# Patient Record
Sex: Female | Born: 1990
Health system: Southern US, Community
[De-identification: ages and names within clinical notes are randomized; demographics above are authoritative.]

## PROBLEM LIST (undated history)

## (undated) DIAGNOSIS — J101 Influenza due to other identified influenza virus with other respiratory manifestations: Secondary | ICD-10-CM

## (undated) DIAGNOSIS — Z789 Other specified health status: Secondary | ICD-10-CM

## (undated) DIAGNOSIS — O139 Gestational [pregnancy-induced] hypertension without significant proteinuria, unspecified trimester: Secondary | ICD-10-CM

## (undated) HISTORY — DX: Other specified health status: Z78.9

## (undated) HISTORY — PX: DILATION AND CURETTAGE OF UTERUS: SHX78

## (undated) HISTORY — DX: Influenza due to other identified influenza virus with other respiratory manifestations: J10.1

## (undated) HISTORY — PX: APPENDECTOMY: SHX54

---

## 2009-12-26 ENCOUNTER — Emergency Department: Payer: Self-pay | Admitting: Emergency Medicine

## 2010-07-09 ENCOUNTER — Emergency Department: Payer: Self-pay | Admitting: Emergency Medicine

## 2012-09-16 ENCOUNTER — Emergency Department: Payer: Self-pay | Admitting: Emergency Medicine

## 2014-04-09 ENCOUNTER — Other Ambulatory Visit (HOSPITAL_COMMUNITY)
Admission: RE | Admit: 2014-04-09 | Discharge: 2014-04-09 | Disposition: A | Discharge status: Home / Self Care | Payer: 59 | Source: Ambulatory Visit | Attending: Family Medicine | Admitting: Family Medicine

## 2014-04-09 DIAGNOSIS — Z0000 Encounter for general adult medical examination without abnormal findings: Secondary | ICD-10-CM | POA: Insufficient documentation

## 2016-11-06 ENCOUNTER — Emergency Department
Admission: EM | Admit: 2016-11-06 | Discharge: 2016-11-06 | Disposition: A | Discharge status: Home / Self Care | Payer: 59 | Attending: Emergency Medicine | Admitting: Emergency Medicine

## 2016-11-06 ENCOUNTER — Encounter: Payer: Self-pay | Admitting: Emergency Medicine

## 2016-11-06 DIAGNOSIS — N12 Tubulo-interstitial nephritis, not specified as acute or chronic: Secondary | ICD-10-CM | POA: Insufficient documentation

## 2016-11-06 LAB — URINALYSIS, ROUTINE W REFLEX MICROSCOPIC
Bilirubin Urine: NEGATIVE
Glucose, UA: NEGATIVE mg/dL
Ketones, ur: 5 mg/dL — AB
Nitrite: POSITIVE — AB
PH: 6 (ref 5.0–8.0)
Protein, ur: NEGATIVE mg/dL
SPECIFIC GRAVITY, URINE: 1.016 (ref 1.005–1.030)

## 2016-11-06 LAB — POCT PREGNANCY, URINE: PREG TEST UR: NEGATIVE

## 2016-11-06 MED ORDER — PHENAZOPYRIDINE HCL 100 MG PO TABS: 100.0000 mg | ORAL_TABLET | Freq: Three times a day (TID) | ORAL | 0 refills | Status: AC | PRN

## 2016-11-06 MED ORDER — LEVOFLOXACIN 750 MG PO TABS: 750.0000 mg | ORAL_TABLET | Freq: Every day | ORAL | 0 refills | Status: AC

## 2016-11-06 NOTE — ED Provider Notes (Signed)
[facility] Emergency Department Provider Note  ____________________________________________  Time seen: Approximately 5:36 PM  I have reviewed the triage vital signs and the nursing notes.   HISTORY  Chief Complaint Back Pain    HPI Danielle Waters is a 26 y.o. female presenting to the emergency department with bilateral flank pain, dysuria and increased urinary frequency for the past 3 days. Patient denies hematuria and history of pyelonephritis and nephrolithiasis. Patient has a history of urinary tract infections. Last UTI was in April of 2017. Patient has never been diagnosed with a STD. She reports no increase in vaginal discharge. She is in a monogamous relationship with one sexual partner. No dyspareunia. Patient has had chills and night sweats. Patient states that bilateral flank pain has worsened today and is intensified with ambulation. She rates her flank pain at 10/10 in intensity and describes it as aching. She has taken Azo but has attempted no other alleviating measures. Patient denies nausea, vomiting and abdominal pain.   History reviewed. No pertinent past medical history.  There are no active problems to display for this patient.   History reviewed. No pertinent surgical history.  Prior to Admission medications   Medication Sig Start Date End Date Taking? Authorizing Provider  levofloxacin (LEVAQUIN) 750 MG tablet Take 1 tablet (750 mg total) by mouth daily. 11/06/16 11/11/16  Orvil Feil, PA-C  phenazopyridine (PYRIDIUM) 100 MG tablet Take 1 tablet (100 mg total) by mouth 3 (three) times daily as needed for pain. 11/06/16 11/08/16  Orvil Feil, PA-C    Allergies Patient has no known allergies.  No family history on file.  Social History Social History  Substance Use Topics  . Smoking status: Never Smoker  . Smokeless tobacco: Never Used  . Alcohol use Yes     Review of Systems  Constitutional: She has had chills and night  sweats. Cardiovascular: no chest pain. Respiratory: no cough. No SOB. Gastrointestinal: No abdominal pain.  No nausea, no vomiting.  No diarrhea.  No constipation. Genitourinary: Patient has dysuria, increased urinary frequency and bilateral flank pain. Musculoskeletal: Patient has low back pain.  Skin: Negative for rash, abrasions, lacerations, ecchymosis. Neurological: Negative for headaches, focal weakness or numbness.   ____________________________________________   PHYSICAL EXAM:  VITAL SIGNS: ED Triage Vitals  Enc Vitals Group     BP 11/06/16 1658 140/67     Pulse Rate 11/06/16 1658 73     Resp 11/06/16 1658 18     Temp 11/06/16 1658 100.2 F (37.9 C)     Temp Source 11/06/16 1658 Oral     SpO2 11/06/16 1658 99 %     Weight 11/06/16 1659 168 lb (76.2 kg)     Height 11/06/16 1659 5\' 2"  (1.575 m)     Head Circumference --      Peak Flow --      Pain Score 11/06/16 1710 10     Pain Loc --      Pain Edu? --      Excl. in GC? --     Constitutional: Alert and oriented. She is sitting upright in chair. She does not appear distressed. Eyes: Conjunctivae are normal. PERRL. EOMI. Cardiovascular: Normal rate, regular rhythm. Normal S1 and S2.  Good peripheral circulation. Respiratory: Normal respiratory effort without tachypnea or retractions. Lungs CTAB. Good air entry to the bases with no decreased or absent breath sounds. Gastrointestinal: Bowel sounds 4 quadrants. She has suprapubic tenderness to palpation. No guarding or rigidity. No palpable  masses. No distention. She has bilateral CVA tenderness. Musculoskeletal: Full range of motion to all extremities. No gross deformities appreciated. Neurologic:  Normal speech and language. No gross focal neurologic deficits are appreciated.  Skin:  Skin is warm, dry and intact. No rash noted. Psychiatric: Mood and affect are normal. Speech and behavior are normal. Patient exhibits appropriate insight and  judgement.   ____________________________________________   LABS (all labs ordered are listed, but only abnormal results are displayed)  Labs Reviewed  URINALYSIS, ROUTINE W REFLEX MICROSCOPIC - Abnormal; Notable for the following:       Result Value   Color, Urine YELLOW (*)    APPearance HAZY (*)    Hgb urine dipstick SMALL (*)    Ketones, ur 5 (*)    Nitrite POSITIVE (*)    Leukocytes, UA MODERATE (*)    Bacteria, UA MANY (*)    Squamous Epithelial / LPF 0-5 (*)    All other components within normal limits  URINE CULTURE  POCT PREGNANCY, URINE  POC URINE PREG, ED   ____________________________________________  EKG   ____________________________________________  RADIOLOGY   No results found.  ____________________________________________    PROCEDURES  Procedure(s) performed:    Procedures    Medications - No data to display   ____________________________________________   INITIAL IMPRESSION / ASSESSMENT AND PLAN / ED COURSE  Pertinent labs & imaging results that were available during my care of the patient were reviewed by me and considered in my medical decision making (see chart for details).  Review of the [place] CSRS was performed in accordance of the [facility] prior to dispensing any controlled drugs.   Assessment and plan: Pyelonephritis: Patient presents to the emergency department with increased urinary frequency, dysuria and bilateral flank pain. Patient denies hematuria. Urinalysis conducted in the emergency department was contributory for cystitis. Urine was sent for culture. Vital signs are reassuring at this time. Patient will be treated as an outpatient. I do not feel that patient meets admission criteria at this time.  Patient was discharged with Levaquin for pyelonephritis. Patient was also discharged with Pyridium for dysuria. Strict return precautions were given twice. Patient was advised to follow up with urology in 1 week. All patient  questions were answered.  ____________________________________________  FINAL CLINICAL IMPRESSION(S) / ED DIAGNOSES  Final diagnoses:  Pyelonephritis      NEW MEDICATIONS STARTED DURING THIS VISIT:  Discharge Medication List as of 11/06/2016  6:30 PM    START taking these medications   Details  levofloxacin (LEVAQUIN) 750 MG tablet Take 1 tablet (750 mg total) by mouth daily., Starting Tue 11/06/2016, Until Sun 11/11/2016, Print    phenazopyridine (PYRIDIUM) 100 MG tablet Take 1 tablet (100 mg total) by mouth 3 (three) times daily as needed for pain., Starting Tue 11/06/2016, Until Thu 11/08/2016, Print            This chart was dictated using voice recognition software/Dragon. Despite best efforts to proofread, errors can occur which can change the meaning. Any change was purely unintentional.    Orvil FeilJaclyn M Woods, PA-C 11/06/16 1910    Myrna Blazeravid Matthew Schaevitz, MD 11/06/16 [phone]

## 2016-11-06 NOTE — ED Triage Notes (Signed)
States she is having lower back pain with some dysuria for the past 2 days

## 2016-11-09 LAB — URINE CULTURE: Culture: >=100000 — AB

## 2017-03-08 ENCOUNTER — Ambulatory Visit (INDEPENDENT_AMBULATORY_CARE_PROVIDER_SITE_OTHER): Payer: BC Managed Care – PPO | Admitting: Obstetrics & Gynecology

## 2017-03-08 ENCOUNTER — Encounter: Payer: Self-pay | Admitting: Obstetrics & Gynecology

## 2017-03-08 VITALS — BP 122/82 | HR 55 | Ht 62.0 in | Wt 201.0 lb

## 2017-03-08 DIAGNOSIS — Z1329 Encounter for screening for other suspected endocrine disorder: Secondary | ICD-10-CM | POA: Diagnosis not present

## 2017-03-08 DIAGNOSIS — Z124 Encounter for screening for malignant neoplasm of cervix: Secondary | ICD-10-CM | POA: Diagnosis not present

## 2017-03-08 DIAGNOSIS — Z1321 Encounter for screening for nutritional disorder: Secondary | ICD-10-CM

## 2017-03-08 DIAGNOSIS — E669 Obesity, unspecified: Secondary | ICD-10-CM

## 2017-03-08 DIAGNOSIS — Z13220 Encounter for screening for lipoid disorders: Secondary | ICD-10-CM

## 2017-03-08 DIAGNOSIS — R635 Abnormal weight gain: Secondary | ICD-10-CM

## 2017-03-08 DIAGNOSIS — Z0000 Encounter for general adult medical examination without abnormal findings: Secondary | ICD-10-CM

## 2017-03-08 DIAGNOSIS — Z131 Encounter for screening for diabetes mellitus: Secondary | ICD-10-CM

## 2017-03-08 NOTE — Patient Instructions (Signed)
Orlistat capsules What is this medicine? Orlistat (OR li stat) is used to help obese people lose weight and keep the weight off while eating a reduced-calorie diet. This medicine decreases the amount of fat that is absorbed from your diet. This medicine may be used for other purposes; ask your health care provider or pharmacist if you have questions. COMMON BRAND NAME(S): alli, Xenical What should I tell my health care provider before I take this medicine? They need to know if you have any of these conditions: -an eating disorder, such as anorexia or bulimia -gallbladder problems or gallstones -HIV or AIDS -kidney stones -liver disease or hepatitis -organ transplant -pancreatitis -problems absorbing food -seizures -stomach or intestine problems -take medicines that treat or prevent blood clots -an unusual or allergic reaction to orlistat, other medicines, foods, dyes, supplements or preservatives -pregnant or trying to get pregnant -breast-feeding How should I use this medicine? Take this medicine by mouth with a glass of water. Follow the directions on the prescription label. Take this medicine with each main meal that contains about 30 percent of the calories from fat or one hour after the meal. Do not take your medicine more often than directed. If you occasionally miss a meal or have a meal without fat, you can skip that dose of this medicine. Talk to your pediatrician regarding the use of this medicine in children. Special care may be needed. While this drug may be prescribed for children as young as 12 years for selected conditions, precautions do apply. Overdosage: If you think you have taken too much of this medicine contact a poison control center or emergency room at once. NOTE: This medicine is only for you. Do not share this medicine with others. What if I miss a dose? If you miss a dose, take it within one hour following the meal that contains fat. If it is almost time for  your next dose, take only that dose. Do not take double or extra doses. What may interact with this medicine? -amiodarone -antiviral medicines for HIV or AIDS -certain medicines for seizures like carbamazepine, phenobarbital, phenytoin -certain medicines that treat or prevent blood clots like warfarin, enoxaparin, dalteparin, apixaban, dabigatran, and rivaroxaban -cyclosporine -diabetes medicines -dietary supplements like beta-carotene and vitamins A, D, E, and K -other weight loss products -thyroid hormones This list may not describe all possible interactions. Give your health care provider a list of all the medicines, herbs, non-prescription drugs, or dietary supplements you use. Also tell them if you smoke, drink alcohol, or use illegal drugs. Some items may interact with your medicine. What should I watch for while using this medicine? Do not use this medicine if you have had an organ transplant. This medicine interferes with some of the medicines used to prevent transplant rejection. This medicine can cause decreased absorption of some vitamins. You should take a daily multivitamin that contains normal amounts of vitamins D, E, K and beta-carotene or vitamin A. Take the multivitamin once per day at bedtime unless otherwise directed by your doctor or healthcare professional. You should use this medicine with a reduced-calorie diet that contains no more than about 30 percent of the calories from fat. Divide your daily intake of fat, carbohydrates, and protein evenly over your 3 main meals. Follow a well-balanced, reduced-calorie, low fat diet. Try starting this diet before taking this medicine. Following a low fat diet can help reduce the possible side effects from this medicine. Do not use this medicine if you are pregnant. Losing  weight while pregnant is not advised and may cause harm to the unborn child. Talk to your health care professional or pharmacist for more information. What side  effects may I notice from receiving this medicine? Side effects that you should report to your doctor or health care professional as soon as possible: -allergic reactions like skin rash, itching or hives, swelling of the face, lips, or tongue -breathing problems -bloody or black, tarry stools -signs of infection like fever or chills -signs and symptoms of kidney stones like blood in the urine; pain in the lower back or side; pain when urinating -signs and symptoms of liver injury like dark yellow or brown urine; general ill feeling or flu-like symptoms; light-colored stools; loss of appetite; nausea; right upper belly pain; unusually weak or tired; yellowing of the eyes or skin -trouble passing urine or change in the amount of urine -uncontrolled, urgent bowel movements -vomiting Side effects that usually do not require medical attention (report to your doctor or health care professional if they continue or are bothersome): -increased number of bowel movements -oily stools (bowel movements may be clear, orange or brown in color) -stomach discomfort, gas This list may not describe all possible side effects. Call your doctor for medical advice about side effects. You may report side effects to FDA at 1-800-FDA-1088. Where should I keep my medicine? Keep out of the reach of children. Storage at room temperature between 15 and 30 degrees C (59 and 86 degrees F). Keep container tightly closed. Throw away any unused medicine after the expiration date. NOTE: This sheet is a summary. It may not cover all possible information. If you have questions about this medicine, talk to your doctor, pharmacist, or health care provider.  2018 Elsevier/Gold Standard (2015-05-18 16:16:49)

## 2017-03-08 NOTE — Progress Notes (Signed)
HPI:      Ms. Danielle Waters is a 26 y.o. G0P0000 who LMP was Patient's last menstrual period was 02/13/2017., she presents today for her annual examination. The patient has no complaints today. The patient is sexually active. Her last pap: approximate date 2015 and was normal. The patient does perform self breast exams.  There is no notable family history of breast or ovarian cancer in her family.  The patient has regular exercise: yes.  The patient denies current symptoms of depression.    GYN History: Contraception: none  PMHx: History reviewed. No pertinent past medical history. History reviewed. No pertinent surgical history. History reviewed. No pertinent family history. Social History  Substance Use Topics  . Smoking status: Never Smoker  . Smokeless tobacco: Never Used  . Alcohol use Yes   No current outpatient prescriptions on file. Allergies: Patient has no known allergies.  Review of Systems  Constitutional: Negative for chills, fever and malaise/fatigue.  HENT: Negative for congestion, sinus pain and sore throat.   Eyes: Negative for blurred vision and pain.  Respiratory: Negative for cough and wheezing.   Cardiovascular: Negative for chest pain and leg swelling.  Gastrointestinal: Negative for abdominal pain, constipation, diarrhea, heartburn, nausea and vomiting.  Genitourinary: Negative for dysuria, frequency, hematuria and urgency.  Musculoskeletal: Negative for back pain, joint pain, myalgias and neck pain.  Skin: Negative for itching and rash.  Neurological: Negative for dizziness, tremors and weakness.  Endo/Heme/Allergies: Does not bruise/bleed easily.  Psychiatric/Behavioral: Negative for depression. The patient is not nervous/anxious and does not have insomnia.     Objective: BP 122/82   Pulse (!) 55   Ht 5\' 2"  (1.575 m)   Wt 201 lb (91.2 kg)   LMP 02/13/2017   BMI 36.76 kg/m   Filed Weights   03/08/17 1400  Weight: 201 lb (91.2 kg)   Body mass  index is 36.76 kg/m. Physical Exam  Constitutional: She is oriented to person, place, and time. She appears well-developed and well-nourished. No distress.  Genitourinary: Rectum normal, vagina normal and uterus normal. Pelvic exam was performed with patient supine. There is no rash or lesion on the right labia. There is no rash or lesion on the left labia. Vagina exhibits no lesion. No bleeding in the vagina. Right adnexum does not display mass and does not display tenderness. Left adnexum does not display mass and does not display tenderness. Cervix does not exhibit motion tenderness, lesion, friability or polyp.   Uterus is mobile and midaxial. Uterus is not enlarged or exhibiting a mass.  HENT:  Head: Normocephalic and atraumatic. Head is without laceration.  Right Ear: Hearing normal.  Left Ear: Hearing normal.  Nose: No epistaxis.  No foreign bodies.  Mouth/Throat: Uvula is midline, oropharynx is clear and moist and mucous membranes are normal.  Eyes: Pupils are equal, round, and reactive to light.  Neck: Normal range of motion. Neck supple. No thyromegaly present.  Cardiovascular: Normal rate and regular rhythm.  Exam reveals no gallop and no friction rub.   No murmur heard. Pulmonary/Chest: Effort normal and breath sounds normal. No respiratory distress. She has no wheezes. Right breast exhibits no mass, no skin change and no tenderness. Left breast exhibits no mass, no skin change and no tenderness.  Abdominal: Soft. Bowel sounds are normal. She exhibits no distension. There is no tenderness. There is no rebound.  Musculoskeletal: Normal range of motion.  Neurological: She is alert and oriented to person, place, and time. No cranial  nerve deficit.  Skin: Skin is warm and dry.  Psychiatric: She has a normal mood and affect. Judgment normal.  Vitals reviewed.   Assessment:  ANNUAL EXAM 1. Annual physical exam   2. Screening for diabetes mellitus   3. Screening for cervical cancer    4. Screening for cholesterol level   5. Encounter for vitamin deficiency screening   6. Screening for thyroid disorder   7. Weight gain   8. Obesity (BMI 35.0-39.9 without comorbidity)    Screening Plan:            1.  Cervical Screening-  Pap smear done today  2. Breast screening- Exam annually and mammogram>40 planned   3. Colonoscopy every 10 years, Hemoccult testing - after age 24  4. Labs Ordered today  5. Counseling for contraception: no method  Other:  1. Annual physical exam Weight gain and obesity discussed Alli can be tried, not as high a concern if conceived while on this med  2. Screening for diabetes mellitus - Glucose, fasting; Future  3. Screening for cervical cancer - IGP, Aptima HPV  4. Screening for cholesterol level - Lipid panel; Future  5. Encounter for vitamin deficiency screening - Vitamin D 1,25 dihydroxy; Future  6. Screening for thyroid disorder - TSH; Future      F/U  Return in about 1 year (around 03/08/2018) for Annual.  Annamarie Major, MD, [facility], [facility] 03/08/2017  2:40 PM

## 2017-03-13 LAB — IGP, APTIMA HPV
HPV APTIMA: NEGATIVE
PAP SMEAR COMMENT: 0

## 2017-05-09 ENCOUNTER — Other Ambulatory Visit: Payer: BC Managed Care – PPO

## 2017-05-09 DIAGNOSIS — Z1321 Encounter for screening for nutritional disorder: Secondary | ICD-10-CM

## 2017-05-09 DIAGNOSIS — Z13220 Encounter for screening for lipoid disorders: Secondary | ICD-10-CM

## 2017-05-09 DIAGNOSIS — Z1329 Encounter for screening for other suspected endocrine disorder: Secondary | ICD-10-CM

## 2017-05-09 DIAGNOSIS — Z131 Encounter for screening for diabetes mellitus: Secondary | ICD-10-CM

## 2017-05-13 LAB — LIPID PANEL
CHOL/HDL RATIO: 3.8 ratio (ref 0.0–4.4)
Cholesterol, Total: 143 mg/dL (ref 100–199)
HDL: 38 mg/dL — ABNORMAL LOW (ref >39)
LDL Calculated: 93 mg/dL (ref 0–99)
TRIGLYCERIDES: 58 mg/dL (ref 0–149)
VLDL Cholesterol Cal: 12 mg/dL (ref 5–40)

## 2017-05-13 LAB — TSH: TSH: 0.803 u[IU]/mL (ref 0.450–4.500)

## 2017-05-13 LAB — VITAMIN D 1,25 DIHYDROXY
Vitamin D 1, 25 (OH)2 Total: 61 pg/mL
Vitamin D3 1, 25 (OH)2: 59 pg/mL

## 2017-05-13 LAB — GLUCOSE, FASTING: Glucose, Plasma: 81 mg/dL (ref 65–99)

## 2017-08-23 ENCOUNTER — Encounter: Payer: Self-pay | Admitting: Obstetrics and Gynecology

## 2017-08-26 ENCOUNTER — Ambulatory Visit (INDEPENDENT_AMBULATORY_CARE_PROVIDER_SITE_OTHER): Payer: BC Managed Care – PPO | Admitting: Obstetrics and Gynecology

## 2017-08-26 ENCOUNTER — Encounter: Payer: Self-pay | Admitting: Obstetrics and Gynecology

## 2017-08-26 VITALS — BP 145/82 | HR 89 | Ht 62.0 in | Wt 202.0 lb

## 2017-08-26 DIAGNOSIS — E282 Polycystic ovarian syndrome: Secondary | ICD-10-CM | POA: Diagnosis not present

## 2017-08-26 DIAGNOSIS — E669 Obesity, unspecified: Secondary | ICD-10-CM | POA: Insufficient documentation

## 2017-08-26 DIAGNOSIS — N939 Abnormal uterine and vaginal bleeding, unspecified: Secondary | ICD-10-CM | POA: Diagnosis not present

## 2017-08-26 DIAGNOSIS — R030 Elevated blood-pressure reading, without diagnosis of hypertension: Secondary | ICD-10-CM | POA: Insufficient documentation

## 2017-08-26 MED ORDER — MEDROXYPROGESTERONE ACETATE 10 MG PO TABS: 10.0000 mg | ORAL_TABLET | Freq: Every day | ORAL | 1 refills | Status: DC

## 2017-08-26 NOTE — Progress Notes (Signed)
[facility].  Had period 08/08/17, prior to this her last period was in June.  Has noticed headaches 2-3weeks. No previous issue with blood pressure. Not currently on any birth control.

## 2017-08-26 NOTE — Patient Instructions (Signed)
Polycystic Ovarian Syndrome °Polycystic ovarian syndrome (PCOS) is a common hormonal disorder among women of reproductive age. In most women with PCOS, many small fluid-filled sacs (cysts) grow on the ovaries, and the cysts are not part of a normal menstrual cycle. PCOS can cause problems with your menstrual periods and make it difficult to get pregnant. It can also cause an increased risk of miscarriage with pregnancy. If it is not treated, PCOS can lead to serious health problems, such as diabetes and heart disease. °What are the causes? °The cause of PCOS is not known, but it may be the result of a combination of certain factors, such as: °· Irregular menstrual cycle. °· High levels of certain hormones (androgens). °· Problems with the hormone that helps to control blood sugar (insulin resistance). °· Certain genes. ° °What increases the risk? °This condition is more likely to develop in women who have a family history of PCOS. °What are the signs or symptoms? °Symptoms of PCOS may include: °· Multiple ovarian cysts. °· Infrequent periods or no periods. °· Periods that are too frequent or too heavy. °· Unpredictable periods. °· Inability to get pregnant (infertility) because of not ovulating. °· Increased growth of hair on the face, chest, stomach, back, thumbs, thighs, or toes. °· Acne or oily skin. Acne may develop during adulthood, and it may not respond to treatment. °· Pelvic pain. °· Weight gain or obesity. °· Patches of thickened and dark brown or black skin on the neck, arms, breasts, or thighs (acanthosis nigricans). °· Excess hair growth on the face, chest, abdomen, or upper thighs (hirsutism). ° °How is this diagnosed? °This condition is diagnosed based on: °· Your medical history. °· A physical exam, including a pelvic exam. Your health care provider may look for areas of increased hair growth on your skin. °· Tests, such as: °? Ultrasound. This may be used to examine the ovaries and the lining of the  uterus (endometrium) for cysts. °? Blood tests. These may be used to check levels of sugar (glucose), female hormone (testosterone), and female hormones (estrogen and progesterone) in your blood. ° °How is this treated? °There is no cure for PCOS, but treatment can help to manage symptoms and prevent more health problems from developing. Treatment varies depending on: °· Your symptoms. °· Whether you want to have a baby or whether you need birth control (contraception). ° °Treatment may include nutrition and lifestyle changes along with: °· Progesterone hormone to start a menstrual period. °· Birth control pills to help you have regular menstrual periods. °· Medicines to make you ovulate, if you want to get pregnant. °· Medicine to reduce excessive hair growth. °· Surgery, in severe cases. This may involve making small holes in one or both of your ovaries. This decreases the amount of testosterone that your body produces. ° °Follow these instructions at home: °· Take over-the-counter and prescription medicines only as told by your health care provider. °· Follow a healthy meal plan. This can help you reduce the effects of PCOS. °? Eat a healthy diet that includes lean proteins, complex carbohydrates, fresh fruits and vegetables, low-fat dairy products, and healthy fats. Make sure to eat enough fiber. °· If you are overweight, lose weight as told by your health care provider. °? Losing 10% of your body weight may improve symptoms. °? Your health care provider can determine how much weight loss is best for you and can help you lose weight safely. °· Keep all follow-up visits as told by   your health care provider. This is important. °Contact a health care provider if: °· Your symptoms do not get better with medicine. °· You develop new symptoms. °This information is not intended to replace advice given to you by your health care provider. Make sure you discuss any questions you have with your health care  provider. °Document Released: 01/25/2005 Document Revised: 05/29/2016 Document Reviewed: 03/18/2016 °Elsevier Interactive Patient Education © 2018 [employer] ° °

## 2017-08-27 LAB — BETA HCG QUANT (REF LAB)

## 2017-08-27 NOTE — Progress Notes (Signed)
Obstetrics and Gynecology New Patient Evaluation  Appointment Date: 08/26/2017  OBGYN Clinic: [facility]  Primary Care Provider: Patient, No Pcp Waters  Referring Provider: self  Chief Complaint: aub, amenorrhea  History of Present Illness: Danielle Waters is a 26 y.o. African-American G0P0000 (Patient's last menstrual period was 08/08/2017.), seen for the above chief complaint. Her past medical history is significant for BMI 30s, ?HTN.  Patient here due to irregular periods. She states that she had normal periods qmonth, 5d and somewhat heavy and painful for the first few days, but starting this year, she's had irregular periods. LMP in October and prior to that was in June and March; her periods were like how they always are and she has no intermenstrual bleeding. She would like to conceive soon.  +hirsutism on face and belly  No breast s/s, fevers, chills, chest pain, SOB, nausea, vomiting, abdominal pain, dysuria, hematuria, vaginal itching, dyspareunia, diarrhea, constipation, blood in BMs  Review of Systems: as noted in the History of Present Illness.  Past Medical History:  History reviewed. No pertinent past medical history.  Past Surgical History:  No past surgical history on file.  Past Obstetrical History:  OB History  Gravida Para Term Preterm AB Living  0 0 0 0 0 0  SAB TAB Ectopic Multiple Live Births  0 0 0 0 0        Past Gynecological History: As Waters HPI. History of Pap Smear(s): Yes.   Last pap 2018, which was NILM History of STI(s): No. She is currently using nothing for contraception. Last used pills years ago   Social History:  Social History   Socioeconomic History  . Marital status: Single    Spouse name: Not on file  . Number of children: Not on file  . Years of education: Not on file  . Highest education level: Not on file  Social Needs  . Financial resource strain: Not on file  . Food insecurity - worry: Not  on file  . Food insecurity - inability: Not on file  . Transportation needs - medical: Not on file  . Transportation needs - non-medical: Not on file  Occupational History  . Not on file  Tobacco Use  . Smoking status: Never Smoker  . Smokeless tobacco: Never Used  Substance and Sexual Activity  . Alcohol use: Yes  . Drug use: No  . Sexual activity: Yes    Birth control/protection: None  Other Topics Concern  . Not on file  Social History Narrative  . Not on file    Family History:  Family History  Problem Relation Age of Onset  . Diabetes Mother   . Hypertension Mother   . Diabetes Maternal Grandmother   . Hypertension Maternal Grandmother    She denies any female cancers, bleeding or blood clotting disorders.   Medications None  Allergies Patient has no known allergies.   Physical Exam:  BP (!) 145/82   Pulse 89   Ht 5\' 2"  (1.575 m)   Wt 202 lb (91.6 kg)   LMP 08/08/2017   BMI 36.95 kg/m  Body mass index is 36.95 kg/m. Weight last year: 160s General appearance: Well nourished, well developed female in no acute distress.  Negative pelvic exam at her 04/2017 annual exam with [facility]  Laboratory: negative lipid panel, pap, tsh, fasting glucose, vitamin D 04/2017  Radiology: none  Assessment: pt stable  Plan:  1. Abnormal uterine bleeding (AUB) D/w her that based on her  s/s (hirsutism, oligomenorrhea) that she clinically has PCOS. Given prior negative exam and no intermenstrual bleeding, I don't feel an u/s would be helpful at this point.  Will get standard labs. D/w her that if labs are negative then best option for her is to cause her to have regular periods to decrease hyperplasia risk until weight loss is done. She is up 40lbs since January 2018 and that's when she last had regular periods. Interventions suggested to her and offered RD/nutrition consult but she declines at this point; I also told her that this also will help with her possible HTN.   Instructions on monthly provera given and pt aware that this isn't contraception.  - beta-hcg - DHEA-Sulfate, Serum - 17-Hydroxyprogesterone - Prolactin - HgB A1c - Testosterone, free - Testosterone - Basic metabolic panel  RTC [id number]  [name], [name] MD Attending [facility](Faculty Practice)

## 2017-08-28 ENCOUNTER — Encounter: Payer: Self-pay | Admitting: Obstetrics and Gynecology

## 2017-08-28 DIAGNOSIS — E282 Polycystic ovarian syndrome: Secondary | ICD-10-CM | POA: Insufficient documentation

## 2017-09-04 ENCOUNTER — Telehealth: Payer: Self-pay

## 2017-09-04 ENCOUNTER — Encounter: Payer: Self-pay | Admitting: Obstetrics and Gynecology

## 2017-09-04 DIAGNOSIS — R7303 Prediabetes: Secondary | ICD-10-CM | POA: Insufficient documentation

## 2017-09-04 LAB — TESTOSTERONE, FREE: Testosterone, Free: 3.1 pg/mL (ref 0.0–4.2)

## 2017-09-04 LAB — PROLACTIN: PROLACTIN: 10.8 ng/mL (ref 4.8–23.3)

## 2017-09-04 LAB — TESTOSTERONE: Testosterone: 54 ng/dL — ABNORMAL HIGH (ref 8–48)

## 2017-09-04 LAB — BASIC METABOLIC PANEL
BUN / CREAT RATIO: 13 (ref 9–23)
BUN: 10 mg/dL (ref 6–20)
CO2: 23 mmol/L (ref 20–29)
CREATININE: 0.8 mg/dL (ref 0.57–1.00)
Calcium: 9.9 mg/dL (ref 8.7–10.2)
Chloride: 102 mmol/L (ref 96–106)
GFR, EST AFRICAN AMERICAN: 116 mL/min/{1.73_m2} (ref >59)
GFR, EST NON AFRICAN AMERICAN: 101 mL/min/{1.73_m2} (ref >59)
Glucose: 68 mg/dL (ref 65–99)
Potassium: 4.6 mmol/L (ref 3.5–5.2)
Sodium: 140 mmol/L (ref 134–144)

## 2017-09-04 LAB — HEMOGLOBIN A1C
Est. average glucose Bld gHb Est-mCnc: 117 mg/dL
HEMOGLOBIN A1C: 5.7 % — AB (ref 4.8–5.6)

## 2017-09-04 LAB — 17-HYDROXYPROGESTERONE: 17 HYDROXYPROGESTERONE: 63 ng/dL

## 2017-09-04 LAB — DHEA-SULFATE, SERUM: DHEA: 382 ug/dL — AB

## 2017-09-04 NOTE — Telephone Encounter (Signed)
Patient called and left a message for the office to call us back. No answer and voice mail was to full to leave a message.

## 2017-09-09 NOTE — Telephone Encounter (Signed)
Call patient no answer

## 2017-09-12 ENCOUNTER — Ambulatory Visit: Payer: BC Managed Care – PPO | Admitting: Nurse Practitioner

## 2017-09-13 ENCOUNTER — Ambulatory Visit: Payer: BC Managed Care – PPO | Admitting: Nurse Practitioner

## 2017-09-17 ENCOUNTER — Encounter: Payer: Self-pay | Admitting: Radiology

## 2017-09-20 ENCOUNTER — Telehealth: Payer: Self-pay | Admitting: Radiology

## 2017-09-20 NOTE — Telephone Encounter (Signed)
Left message on the cell phone voicemail to call [facility] to schedule 3 month f/u appointment in February with Dr Vergie LivingPickens

## 2018-01-30 ENCOUNTER — Ambulatory Visit: Payer: BC Managed Care – PPO | Admitting: Family Medicine

## 2018-02-06 ENCOUNTER — Encounter: Payer: Self-pay | Admitting: Obstetrics & Gynecology

## 2018-02-06 ENCOUNTER — Ambulatory Visit (INDEPENDENT_AMBULATORY_CARE_PROVIDER_SITE_OTHER): Payer: BLUE CROSS/BLUE SHIELD | Admitting: Obstetrics & Gynecology

## 2018-02-06 VITALS — BP 116/75 | HR 51 | Ht 62.0 in | Wt 207.0 lb

## 2018-02-06 DIAGNOSIS — Z01419 Encounter for gynecological examination (general) (routine) without abnormal findings: Secondary | ICD-10-CM

## 2018-02-06 DIAGNOSIS — N970 Female infertility associated with anovulation: Secondary | ICD-10-CM

## 2018-02-06 DIAGNOSIS — Z113 Encounter for screening for infections with a predominantly sexual mode of transmission: Secondary | ICD-10-CM

## 2018-02-06 DIAGNOSIS — Z0000 Encounter for general adult medical examination without abnormal findings: Secondary | ICD-10-CM

## 2018-02-06 NOTE — Progress Notes (Signed)
Patient ID: Danielle ReedyCourtney Anes, female   DOB: 05/08/1991, 27 y.o.   MRN: [id number]  Chief Complaint  Patient presents with  . Gynecologic Exam    HPI Danielle Waters is a 27 y.o. female.  Single G0 here today to discuss her desire for pregnancy. She saw Dr. Vergie LivingPickens 11/18 and was diagnosed clinically with PCOS. He encouraged her to lose weight and did blood work. The blood work was normal. She took provera for 2 cycles. If she does not take provera, then she will skip some periods, had about 10 periods in 2018.   She has been with her current partner for about 18 months. He does not have children. She has been having unprotected sex for about a year, she uses OPKs and does not ovulate.  HPI  History reviewed. No pertinent past medical history.  History reviewed. No pertinent surgical history.  Family History  Problem Relation Age of Onset  . Diabetes Mother   . Hypertension Mother   . Diabetes Maternal Grandmother   . Hypertension Maternal Grandmother     Social History Social History   Tobacco Use  . Smoking status: Never Smoker  . Smokeless tobacco: Never Used  Substance Use Topics  . Alcohol use: Yes  . Drug use: No    No Known Allergies  Current Outpatient Medications  Medication Sig Dispense Refill  . medroxyPROGESTERone (PROVERA) 10 MG tablet Take 1 tablet (10 mg total) daily by mouth. One tab po qday x 14d per month. You can expect a period to start a few days after stopping it. 28 tablet 1   No current facility-administered medications for this visit.     Review of Systems Review of Systems  Pap smear normal 5/18  Blood pressure 116/75, pulse (!) 51, height 5\' 2"  (1.575 m), weight 207 lb (93.9 kg), last menstrual period 01/31/2018.  Physical Exam Physical Exam  Data Reviewed Status:  Final result Visible to patient:  Yes (MyChart) Next appt:  None Dx:  Screening for cervical cancer    Ref Range & Units 45mo ago   DIAGNOSIS:  Comment   Comment: NEGATIVE  FOR INTRAEPITHELIAL LESION AND MALIGNANCY.         Assessment    Desire for pregnancy- already taking PNVs Clearly needs ovulation induction but will also need semen analysis I will refer her to Dr. April MansonYalcinkaya    Plan    Rec healthy lifestyle STI screening       Danielle Waters 02/06/2018, 11:39 AM

## 2018-02-07 LAB — CERVICOVAGINAL ANCILLARY ONLY
Chlamydia: NEGATIVE
Neisseria Gonorrhea: NEGATIVE

## 2018-02-07 LAB — HIV ANTIBODY (ROUTINE TESTING W REFLEX): HIV Screen 4th Generation wRfx: NONREACTIVE

## 2018-02-07 LAB — HEPATITIS C ANTIBODY: Hep C Virus Ab: <0.1 s/co ratio (ref 0.0–0.9)

## 2018-02-07 LAB — HEPATITIS B SURFACE ANTIGEN: Hepatitis B Surface Ag: NEGATIVE

## 2018-02-07 LAB — RPR: RPR: NONREACTIVE

## 2018-03-04 ENCOUNTER — Telehealth: Payer: Self-pay | Admitting: Radiology

## 2018-03-04 NOTE — Telephone Encounter (Signed)
Left voicemail to see if patient was able to schedule an appointment with [facility]. Requested a call back to [facility] to confirm.

## 2018-03-06 ENCOUNTER — Encounter: Payer: Self-pay | Admitting: Radiology

## 2018-06-19 ENCOUNTER — Encounter: Payer: Self-pay | Admitting: Radiology

## 2018-06-30 ENCOUNTER — Other Ambulatory Visit (INDEPENDENT_AMBULATORY_CARE_PROVIDER_SITE_OTHER): Payer: BLUE CROSS/BLUE SHIELD

## 2018-06-30 VITALS — BP 122/77 | HR 78 | Resp 16 | Ht 62.0 in | Wt 195.4 lb

## 2018-06-30 DIAGNOSIS — R350 Frequency of micturition: Secondary | ICD-10-CM | POA: Diagnosis not present

## 2018-06-30 DIAGNOSIS — R300 Dysuria: Secondary | ICD-10-CM

## 2018-06-30 LAB — POCT URINALYSIS DIP (MANUAL ENTRY)
BILIRUBIN UA: NEGATIVE mg/dL
Bilirubin, UA: NEGATIVE
Glucose, UA: NEGATIVE mg/dL
LEUKOCYTES UA: NEGATIVE
Nitrite, UA: NEGATIVE
PH UA: 8.5 — AB (ref 5.0–8.0)
PROTEIN UA: NEGATIVE mg/dL
UROBILINOGEN UA: 0.2 U/dL

## 2018-06-30 NOTE — Progress Notes (Signed)
I have reviewed the chart and agree with nursing staff's documentation of this patient's encounter.  Danielle CollinsUgonna Salvador Bigbee, MD 06/30/2018 3:37 PM

## 2018-06-30 NOTE — Progress Notes (Signed)
SUBJECTIVE: Danielle Waters is a 27 y.o. female who complains of urinary frequency, urgency, odor and low right side flank pain for the last week. Patient reports she had a D&[facility] due to [facility].  Patient denies fever, chills, or abnormal vaginal discharge or bleeding.   OBJECTIVE: Appears well, in no apparent distress.  Vital signs are normal. Urine dipstick shows abnormal specific gravity, blood in urine, elevated pH and odor.   ASSESSMENT: Urinary frequency, dysuria, odor  PLAN:   Patient advised urine will be sent for culture. Patient advised she will be contacted by our office once lab results come in and are reviewed by a provider. Patient advised antibiotic will be prescribed if necessary. Call or return to clinic prn if these symptoms worsen or fail to improve as anticipated.

## 2018-07-01 LAB — URINE CULTURE: Organism ID, Bacteria: NO GROWTH

## 2018-07-10 ENCOUNTER — Other Ambulatory Visit (INDEPENDENT_AMBULATORY_CARE_PROVIDER_SITE_OTHER): Payer: BLUE CROSS/BLUE SHIELD

## 2018-07-10 ENCOUNTER — Encounter: Payer: Self-pay | Admitting: Radiology

## 2018-07-10 DIAGNOSIS — Z3202 Encounter for pregnancy test, result negative: Secondary | ICD-10-CM | POA: Diagnosis not present

## 2018-07-10 LAB — POCT URINE PREGNANCY: PREG TEST UR: NEGATIVE

## 2018-07-10 NOTE — Progress Notes (Signed)
Danielle Waters presents today for UPT. She reports having a D/C in August and has felt different since then.No other complaints    LMP: unknown before the D/C  OBJECTIVE: Appears well, in no apparent distress.  OB History    Gravida  0   Para  0   Term  0   Preterm  0   AB  0   Living  0     SAB  0   TAB  0   Ectopic  0   Multiple  0   Live Births  0          Home UPT Result: not taken at home In-Office UPT result: negative I have reviewed the patient's medical, obstetrical, social, and family histories, and medications.   ASSESSMENT: Negative pregnancy test  PLAN Follow up as needed

## 2018-10-15 NOTE — L&D Delivery Note (Signed)
Delivery Note Pt pushed x 2 contractions. At 3:40 PM a viable female was delivered via Vaginal, Spontaneous (Presentation: ROA). Shoulders delivered w/out difficulty. Compound right arm. APGAR: 5, 9; weight  5-11. NICU team present, but left due to infant w/ strong cry. Pitocin bolus started. Placenta status: Spontaneous, intact.  Cord: 3VC with the following complications: None.  Cord pH: NA  Infant w/ strong cry, but remained dusky despite stimulation. Taken to warmer for O2. NICU team called to return and assess baby.   Anesthesia: None Episiotomy: None Lacerations: 1st degree Suture Repair: NA. Laceration hemostatic w/ pressure Est. Blood Loss (mL): 250  Mom to [facility].  Baby to Couplet care / Skin to Skin. Magnesium Sulfate x 24 hours.  Please schedule this patient for Postpartum visit in: 4 weeks with the following provider: Any provider For C/S patients schedule nurse incision check in weeks 2 weeks: NA High risk pregnancy complicated by: HTN Delivery mode:  SVD Anticipated Birth Control:  LAM PP Procedures needed: BP check ~9/3 Schedule Integrated [place] visit: no   [place] 06/14/2019, 4:57 PM

## 2018-10-15 NOTE — L&D Delivery Note (Signed)
Infant taken to warmer at 1 minute.  Pulse ox applied. (37%)  Blow By (10L)  oxygen intiated x 3 minutes.  O2 level gradually increased to 90% 1552 NICU in room to evaluate infant.

## 2018-10-30 ENCOUNTER — Encounter
Admission: EM | Disposition: A | Discharge status: Home / Self Care | Payer: Self-pay | Source: Home / Self Care | Attending: Emergency Medicine

## 2018-10-30 ENCOUNTER — Observation Stay
Admission: EM | Admit: 2018-10-30 | Discharge: 2018-10-31 | Disposition: A | Discharge status: Home / Self Care | Payer: BLUE CROSS/BLUE SHIELD | Attending: Surgery | Admitting: Surgery

## 2018-10-30 ENCOUNTER — Observation Stay: Payer: BLUE CROSS/BLUE SHIELD | Admitting: Anesthesiology

## 2018-10-30 ENCOUNTER — Other Ambulatory Visit: Payer: Self-pay

## 2018-10-30 ENCOUNTER — Emergency Department: Payer: BLUE CROSS/BLUE SHIELD

## 2018-10-30 DIAGNOSIS — J101 Influenza due to other identified influenza virus with other respiratory manifestations: Secondary | ICD-10-CM | POA: Diagnosis not present

## 2018-10-30 DIAGNOSIS — Z6830 Body mass index (BMI) 30.0-30.9, adult: Secondary | ICD-10-CM | POA: Diagnosis not present

## 2018-10-30 DIAGNOSIS — E282 Polycystic ovarian syndrome: Secondary | ICD-10-CM | POA: Insufficient documentation

## 2018-10-30 DIAGNOSIS — Z79811 Long term (current) use of aromatase inhibitors: Secondary | ICD-10-CM | POA: Insufficient documentation

## 2018-10-30 DIAGNOSIS — Z7984 Long term (current) use of oral hypoglycemic drugs: Secondary | ICD-10-CM | POA: Diagnosis not present

## 2018-10-30 DIAGNOSIS — E669 Obesity, unspecified: Secondary | ICD-10-CM | POA: Diagnosis not present

## 2018-10-30 DIAGNOSIS — I10 Essential (primary) hypertension: Secondary | ICD-10-CM | POA: Diagnosis not present

## 2018-10-30 DIAGNOSIS — K760 Fatty (change of) liver, not elsewhere classified: Secondary | ICD-10-CM | POA: Diagnosis not present

## 2018-10-30 DIAGNOSIS — Z833 Family history of diabetes mellitus: Secondary | ICD-10-CM | POA: Insufficient documentation

## 2018-10-30 DIAGNOSIS — K3580 Unspecified acute appendicitis: Secondary | ICD-10-CM | POA: Diagnosis not present

## 2018-10-30 DIAGNOSIS — R599 Enlarged lymph nodes, unspecified: Secondary | ICD-10-CM | POA: Insufficient documentation

## 2018-10-30 DIAGNOSIS — R7303 Prediabetes: Secondary | ICD-10-CM | POA: Diagnosis not present

## 2018-10-30 HISTORY — PX: LAPAROSCOPIC APPENDECTOMY: SHX408

## 2018-10-30 LAB — URINALYSIS, COMPLETE (UACMP) WITH MICROSCOPIC
Bacteria, UA: NONE SEEN
Bilirubin Urine: NEGATIVE
Glucose, UA: NEGATIVE mg/dL
Hgb urine dipstick: NEGATIVE
Ketones, ur: 80 mg/dL — AB
Leukocytes, UA: NEGATIVE
Nitrite: NEGATIVE
Protein, ur: NEGATIVE mg/dL
Specific Gravity, Urine: 1.024 (ref 1.005–1.030)
pH: 6 (ref 5.0–8.0)

## 2018-10-30 LAB — LIPASE, BLOOD: Lipase: 23 U/L (ref 11–51)

## 2018-10-30 LAB — CBC WITH DIFFERENTIAL/PLATELET
Abs Immature Granulocytes: 0.01 10*3/uL (ref 0.00–0.07)
Basophils Absolute: 0 10*3/uL (ref 0.0–0.1)
Basophils Relative: 0 %
Eosinophils Absolute: 0 10*3/uL (ref 0.0–0.5)
Eosinophils Relative: 0 %
HCT: 41.6 % (ref 36.0–46.0)
HEMOGLOBIN: 14.1 g/dL (ref 12.0–15.0)
Immature Granulocytes: 0 %
LYMPHS PCT: 18 %
Lymphs Abs: 1.3 10*3/uL (ref 0.7–4.0)
MCH: 32.4 pg (ref 26.0–34.0)
MCHC: 33.9 g/dL (ref 30.0–36.0)
MCV: 95.6 fL (ref 80.0–100.0)
Monocytes Absolute: 0.5 10*3/uL (ref 0.1–1.0)
Monocytes Relative: 7 %
Neutro Abs: 5.7 10*3/uL (ref 1.7–7.7)
Neutrophils Relative %: 75 %
Platelets: 236 10*3/uL (ref 150–400)
RBC: 4.35 MIL/uL (ref 3.87–5.11)
RDW: 12.7 % (ref 11.5–15.5)
WBC: 7.6 10*3/uL (ref 4.0–10.5)
nRBC: 0 % (ref 0.0–0.2)

## 2018-10-30 LAB — TROPONIN I

## 2018-10-30 LAB — COMPREHENSIVE METABOLIC PANEL
ALT: 17 U/L (ref 0–44)
AST: 23 U/L (ref 15–41)
Albumin: 3.9 g/dL (ref 3.5–5.0)
Alkaline Phosphatase: 66 U/L (ref 38–126)
Anion gap: 10 (ref 5–15)
BUN: 9 mg/dL (ref 6–20)
CO2: 21 mmol/L — ABNORMAL LOW (ref 22–32)
Calcium: 9 mg/dL (ref 8.9–10.3)
Chloride: 105 mmol/L (ref 98–111)
Creatinine, Ser: 0.78 mg/dL (ref 0.44–1.00)
GFR calc Af Amer: >60 mL/min (ref >60)
GFR calc non Af Amer: >60 mL/min (ref >60)
Glucose, Bld: 114 mg/dL — ABNORMAL HIGH (ref 70–99)
Potassium: 3.9 mmol/L (ref 3.5–5.1)
Sodium: 136 mmol/L (ref 135–145)
Total Bilirubin: 0.7 mg/dL (ref 0.3–1.2)
Total Protein: 7.5 g/dL (ref 6.5–8.1)

## 2018-10-30 LAB — INFLUENZA PANEL BY PCR (TYPE A & B)
Influenza A By PCR: POSITIVE — AB
Influenza B By PCR: NEGATIVE

## 2018-10-30 LAB — POCT PREGNANCY, URINE: Preg Test, Ur: NEGATIVE

## 2018-10-30 SURGERY — APPENDECTOMY, LAPAROSCOPIC
Anesthesia: General | Site: Abdomen

## 2018-10-30 MED ORDER — OXYCODONE HCL 5 MG PO TABS
5.0000 mg | ORAL_TABLET | ORAL | Status: DC | PRN
  Administered 2018-10-30 – 2018-10-31 (×2): 5 mg via ORAL
  Filled 2018-10-30 (×2): qty 1

## 2018-10-30 MED ORDER — HYDROMORPHONE HCL 1 MG/ML IJ SOLN: 0.5000 mg | INTRAMUSCULAR | Status: DC | PRN

## 2018-10-30 MED ORDER — ROCURONIUM BROMIDE 50 MG/5ML IV SOLN
INTRAVENOUS | Status: AC
  Filled 2018-10-30: qty 1

## 2018-10-30 MED ORDER — PIPERACILLIN-TAZOBACTAM 3.375 G IVPB 30 MIN: 3.3750 g | Freq: Once | INTRAVENOUS | Status: DC

## 2018-10-30 MED ORDER — FENTANYL CITRATE (PF) 100 MCG/2ML IJ SOLN
INTRAMUSCULAR | Status: AC
  Filled 2018-10-30: qty 2

## 2018-10-30 MED ORDER — MORPHINE SULFATE (PF) 4 MG/ML IV SOLN
4.0000 mg | Freq: Once | INTRAVENOUS | Status: AC
  Administered 2018-10-30: 4 mg via INTRAVENOUS
  Filled 2018-10-30: qty 1

## 2018-10-30 MED ORDER — SODIUM CHLORIDE FLUSH 0.9 % IV SOLN
INTRAVENOUS | Status: AC
  Filled 2018-10-30: qty 10

## 2018-10-30 MED ORDER — KETOROLAC TROMETHAMINE 30 MG/ML IJ SOLN
INTRAMUSCULAR | Status: AC
  Filled 2018-10-30: qty 1

## 2018-10-30 MED ORDER — PROPOFOL 500 MG/50ML IV EMUL
INTRAVENOUS | Status: DC | PRN
  Administered 2018-10-30: 20 ug/kg/min via INTRAVENOUS

## 2018-10-30 MED ORDER — ACETAMINOPHEN 10 MG/ML IV SOLN
INTRAVENOUS | Status: DC | PRN
  Administered 2018-10-30: 1000 mg via INTRAVENOUS

## 2018-10-30 MED ORDER — SUGAMMADEX SODIUM 200 MG/2ML IV SOLN
INTRAVENOUS | Status: DC | PRN
  Administered 2018-10-30: 160 mg via INTRAVENOUS

## 2018-10-30 MED ORDER — PANTOPRAZOLE SODIUM 40 MG IV SOLR
40.0000 mg | Freq: Every day | INTRAVENOUS | Status: DC
  Administered 2018-10-30: 40 mg via INTRAVENOUS
  Filled 2018-10-30: qty 40

## 2018-10-30 MED ORDER — FENTANYL CITRATE (PF) 100 MCG/2ML IJ SOLN
INTRAMUSCULAR | Status: DC | PRN
  Administered 2018-10-30: 50 ug via INTRAVENOUS
  Administered 2018-10-30: 25 ug via INTRAVENOUS
  Administered 2018-10-30: 100 ug via INTRAVENOUS
  Administered 2018-10-30: 25 ug via INTRAVENOUS

## 2018-10-30 MED ORDER — ONDANSETRON HCL 4 MG/2ML IJ SOLN
4.0000 mg | Freq: Once | INTRAMUSCULAR | Status: AC
  Administered 2018-10-30: 4 mg via INTRAVENOUS
  Filled 2018-10-30: qty 2

## 2018-10-30 MED ORDER — LACTATED RINGERS IV SOLN
125.0000 mL/h | INTRAVENOUS | Status: DC
  Administered 2018-10-30: 125 mL/h via INTRAVENOUS
  Administered 2018-10-30: 14:00:00 via INTRAVENOUS
  Administered 2018-10-31: 125 mL/h via INTRAVENOUS

## 2018-10-30 MED ORDER — ACETAMINOPHEN 500 MG PO TABS: 1000.0000 mg | ORAL_TABLET | Freq: Four times a day (QID) | ORAL | Status: DC | PRN

## 2018-10-30 MED ORDER — FENTANYL CITRATE (PF) 100 MCG/2ML IJ SOLN: 25.0000 ug | INTRAMUSCULAR | Status: DC | PRN

## 2018-10-30 MED ORDER — BUPIVACAINE-EPINEPHRINE (PF) 0.5% -1:200000 IJ SOLN
INTRAMUSCULAR | Status: DC | PRN
  Administered 2018-10-30: 30 mL via PERINEURAL

## 2018-10-30 MED ORDER — ENOXAPARIN SODIUM 40 MG/0.4ML ~~LOC~~ SOLN
40.0000 mg | SUBCUTANEOUS | Status: DC
  Administered 2018-10-31: 40 mg via SUBCUTANEOUS
  Filled 2018-10-30: qty 0.4

## 2018-10-30 MED ORDER — ONDANSETRON HCL 4 MG/2ML IJ SOLN: 4.0000 mg | Freq: Four times a day (QID) | INTRAMUSCULAR | Status: DC | PRN

## 2018-10-30 MED ORDER — EPHEDRINE SULFATE 50 MG/ML IJ SOLN
INTRAMUSCULAR | Status: AC
  Filled 2018-10-30: qty 1

## 2018-10-30 MED ORDER — EPHEDRINE SULFATE 50 MG/ML IJ SOLN
INTRAMUSCULAR | Status: DC | PRN
  Administered 2018-10-30: 10 mg via INTRAVENOUS

## 2018-10-30 MED ORDER — ONDANSETRON 4 MG PO TBDP: 4.0000 mg | ORAL_TABLET | Freq: Four times a day (QID) | ORAL | Status: DC | PRN

## 2018-10-30 MED ORDER — SODIUM CHLORIDE 0.9 % IV SOLN
Freq: Once | INTRAVENOUS | Status: AC
  Administered 2018-10-30: 07:00:00 via INTRAVENOUS

## 2018-10-30 MED ORDER — ROCURONIUM BROMIDE 100 MG/10ML IV SOLN
INTRAVENOUS | Status: DC | PRN
  Administered 2018-10-30: 30 mg via INTRAVENOUS
  Administered 2018-10-30: 10 mg via INTRAVENOUS

## 2018-10-30 MED ORDER — PHENYLEPHRINE HCL 10 MG/ML IJ SOLN
INTRAMUSCULAR | Status: DC | PRN
  Administered 2018-10-30: 100 ug via INTRAVENOUS

## 2018-10-30 MED ORDER — IOHEXOL 300 MG/ML  SOLN
100.0000 mL | Freq: Once | INTRAMUSCULAR | Status: AC | PRN
  Administered 2018-10-30: 100 mL via INTRAVENOUS

## 2018-10-30 MED ORDER — DEXAMETHASONE SODIUM PHOSPHATE 10 MG/ML IJ SOLN
INTRAMUSCULAR | Status: DC | PRN
  Administered 2018-10-30: 10 mg via INTRAVENOUS

## 2018-10-30 MED ORDER — DEXAMETHASONE SODIUM PHOSPHATE 10 MG/ML IJ SOLN
INTRAMUSCULAR | Status: AC
  Filled 2018-10-30: qty 1

## 2018-10-30 MED ORDER — SUGAMMADEX SODIUM 200 MG/2ML IV SOLN
INTRAVENOUS | Status: AC
  Filled 2018-10-30: qty 2

## 2018-10-30 MED ORDER — PIPERACILLIN-TAZOBACTAM 3.375 G IVPB
3.3750 g | Freq: Three times a day (TID) | INTRAVENOUS | Status: DC
  Administered 2018-10-30 – 2018-10-31 (×4): 3.375 g via INTRAVENOUS
  Filled 2018-10-30 (×3): qty 50

## 2018-10-30 MED ORDER — SODIUM CHLORIDE 0.9 % IV SOLN
INTRAVENOUS | Status: DC
  Administered 2018-10-30: 16:00:00 via INTRAVENOUS

## 2018-10-30 MED ORDER — PROPOFOL 10 MG/ML IV BOLUS
INTRAVENOUS | Status: AC
  Filled 2018-10-30: qty 20

## 2018-10-30 MED ORDER — PROPOFOL 10 MG/ML IV BOLUS
INTRAVENOUS | Status: DC | PRN
  Administered 2018-10-30: 150 mg via INTRAVENOUS

## 2018-10-30 MED ORDER — GLYCOPYRROLATE 0.2 MG/ML IJ SOLN
INTRAMUSCULAR | Status: DC | PRN
  Administered 2018-10-30: 0.2 mg via INTRAVENOUS

## 2018-10-30 MED ORDER — KETOROLAC TROMETHAMINE 30 MG/ML IJ SOLN
INTRAMUSCULAR | Status: DC | PRN
  Administered 2018-10-30: 30 mg via INTRAVENOUS

## 2018-10-30 MED ORDER — ACETAMINOPHEN 10 MG/ML IV SOLN
INTRAVENOUS | Status: AC
  Filled 2018-10-30: qty 100

## 2018-10-30 MED ORDER — LIDOCAINE HCL (CARDIAC) PF 100 MG/5ML IV SOSY
PREFILLED_SYRINGE | INTRAVENOUS | Status: DC | PRN
  Administered 2018-10-30: 100 mg via INTRAVENOUS

## 2018-10-30 MED ORDER — ONDANSETRON HCL 4 MG/2ML IJ SOLN: 4.0000 mg | Freq: Once | INTRAMUSCULAR | Status: DC | PRN

## 2018-10-30 MED ORDER — KETOROLAC TROMETHAMINE 30 MG/ML IJ SOLN
30.0000 mg | Freq: Four times a day (QID) | INTRAMUSCULAR | Status: DC
  Administered 2018-10-30 – 2018-10-31 (×4): 30 mg via INTRAVENOUS
  Filled 2018-10-30 (×4): qty 1

## 2018-10-30 MED ORDER — MIDAZOLAM HCL 2 MG/2ML IJ SOLN
INTRAMUSCULAR | Status: DC | PRN
  Administered 2018-10-30: 2 mg via INTRAVENOUS

## 2018-10-30 MED ORDER — SUCCINYLCHOLINE CHLORIDE 20 MG/ML IJ SOLN
INTRAMUSCULAR | Status: DC | PRN
  Administered 2018-10-30: 90 mg via INTRAVENOUS

## 2018-10-30 MED ORDER — MIDAZOLAM HCL 2 MG/2ML IJ SOLN
INTRAMUSCULAR | Status: AC
  Filled 2018-10-30: qty 2

## 2018-10-30 MED ORDER — INFLUENZA VAC SPLIT QUAD 0.5 ML IM SUSY: 0.5000 mL | PREFILLED_SYRINGE | INTRAMUSCULAR | Status: DC

## 2018-10-30 MED ORDER — PIPERACILLIN-TAZOBACTAM 3.375 G IVPB
INTRAVENOUS | Status: AC
  Filled 2018-10-30: qty 50

## 2018-10-30 SURGICAL SUPPLY — 45 items
CANISTER SUCT 1200ML W/VALVE (MISCELLANEOUS) ×3 IMPLANT
CHLORAPREP W/TINT 26ML (MISCELLANEOUS) ×3 IMPLANT
COVER WAND RF STERILE (DRAPES) ×3 IMPLANT
CUTTER FLEX LINEAR 45M (STAPLE) ×2 IMPLANT
DERMABOND ADVANCED (GAUZE/BANDAGES/DRESSINGS) ×2
DERMABOND ADVANCED .7 DNX12 (GAUZE/BANDAGES/DRESSINGS) ×1 IMPLANT
ELECT CAUTERY BLADE 6.4 (BLADE) ×3 IMPLANT
ELECT REM PT RETURN 9FT ADLT (ELECTROSURGICAL) ×3
ELECTRODE REM PT RTRN 9FT ADLT (ELECTROSURGICAL) ×1 IMPLANT
GLOVE SURG SYN 7.0 (GLOVE) ×3 IMPLANT
GLOVE SURG SYN 7.0 PF PI (GLOVE) ×1 IMPLANT
GLOVE SURG SYN 7.5  E (GLOVE) ×2
GLOVE SURG SYN 7.5 E (GLOVE) ×1 IMPLANT
GLOVE SURG SYN 7.5 PF PI (GLOVE) ×1 IMPLANT
GOWN STRL REUS W/ TWL LRG LVL3 (GOWN DISPOSABLE) ×2 IMPLANT
GOWN STRL REUS W/TWL LRG LVL3 (GOWN DISPOSABLE) ×4
HANDLE YANKAUER SUCT BULB TIP (MISCELLANEOUS) ×2 IMPLANT
IRRIGATION STRYKERFLOW (MISCELLANEOUS) IMPLANT
IRRIGATOR STRYKERFLOW (MISCELLANEOUS)
IV NS 1000ML (IV SOLUTION) ×2
IV NS 1000ML BAXH (IV SOLUTION) ×1 IMPLANT
KIT TURNOVER KIT A (KITS) ×3 IMPLANT
LABEL OR SOLS (LABEL) ×3 IMPLANT
LIGASURE LAP MARYLAND 5MM 37CM (ELECTROSURGICAL) ×2 IMPLANT
NEEDLE HYPO 22GX1.5 SAFETY (NEEDLE) ×3 IMPLANT
NS IRRIG 500ML POUR BTL (IV SOLUTION) ×3 IMPLANT
PACK LAP CHOLECYSTECTOMY (MISCELLANEOUS) ×3 IMPLANT
PENCIL ELECTRO HAND CTR (MISCELLANEOUS) ×3 IMPLANT
POUCH SPECIMEN RETRIEVAL 10MM (ENDOMECHANICALS) ×3 IMPLANT
RELOAD 45 VASCULAR/THIN (ENDOMECHANICALS) ×3 IMPLANT
RELOAD STAPLE 45 2.5 WHT GRN (ENDOMECHANICALS) IMPLANT
RELOAD STAPLE 45 3.5 BLU ETS (ENDOMECHANICALS) ×1 IMPLANT
RELOAD STAPLE TA45 3.5 REG BLU (ENDOMECHANICALS) ×3 IMPLANT
SCISSORS METZENBAUM CVD 33 (INSTRUMENTS) ×3 IMPLANT
SLEEVE ADV FIXATION 5X100MM (TROCAR) ×6 IMPLANT
SUT MNCRL 4-0 (SUTURE) ×4
SUT MNCRL 4-0 27XMFL (SUTURE) ×2
SUT VIC AB 3-0 SH 27 (SUTURE) ×2
SUT VIC AB 3-0 SH 27X BRD (SUTURE) IMPLANT
SUT VICRYL 0 AB UR-6 (SUTURE) ×3 IMPLANT
SUTURE MNCRL 4-0 27XMF (SUTURE) ×1 IMPLANT
TRAY FOLEY MTR SLVR 16FR STAT (SET/KITS/TRAYS/PACK) ×3 IMPLANT
TROCAR BALLN GELPORT 12X130M (ENDOMECHANICALS) ×3 IMPLANT
TROCAR Z-THREAD OPTICAL 5X100M (TROCAR) ×3 IMPLANT
TUBING INSUFFLATION (TUBING) ×3 IMPLANT

## 2018-10-30 NOTE — Transfer of Care (Signed)
Immediate Anesthesia Transfer of Care Note  Patient: Danielle Waters  Procedure(s) Performed: APPENDECTOMY LAPAROSCOPIC (N/A Abdomen)  Patient Location: PACU  Anesthesia Type:General  Level of Consciousness: sedated  Airway & Oxygen Therapy: Patient Spontanous Breathing and Patient connected to face mask oxygen  Post-op Assessment: Report given to RN and Post -op Vital signs reviewed and stable  Post vital signs: Reviewed and stable  Last Vitals:  Vitals Value Taken Time  BP 137/66 10/30/2018  3:40 PM  Temp    Pulse 55 10/30/2018  3:45 PM  Resp 16 10/30/2018  3:45 PM  SpO2 100 % 10/30/2018  3:45 PM  Vitals shown include unvalidated device data.  Last Pain:  Vitals:   10/30/18 1330  TempSrc: Temporal  PainSc: 10-Worst pain ever         Complications: No apparent anesthesia complications

## 2018-10-30 NOTE — ED Triage Notes (Signed)
Pt arrived from home via [facility]. EMS reports that pt began to have cold-like symptoms on last week, she was seen in the Urgent Care on Tuesday and on tonight she began experiencing right side abdominal pain, diarrhea, nausea, vomiting and chills. Pt reports a recent miscarriage with d&c in September of last year and history of bradycardia. At this time the pt c/o abdominal pain at 10/10 and nausea. She denies chest pain, shortness of breath, coffee ground emesis or hematochezia.

## 2018-10-30 NOTE — Anesthesia Preprocedure Evaluation (Signed)
Anesthesia Evaluation  Patient identified by MRN, date of birth, ID band Patient awake    Reviewed: Allergy & Precautions, NPO status , Patient's Chart, lab work & pertinent test results, reviewed documented beta blocker date and time   Airway Mallampati: III  TM Distance: >3 FB     Dental  (+) Chipped   Pulmonary           Cardiovascular hypertension,      Neuro/Psych    GI/Hepatic   Endo/Other    Renal/GU      Musculoskeletal   Abdominal   Peds  Hematology   Anesthesia Other Findings Obese. Has the flu. HR in the 40's. PCOS.  Reproductive/Obstetrics                             Anesthesia Physical Anesthesia Plan  ASA: III  Anesthesia Plan: General   Post-op Pain Management:    Induction: Intravenous  PONV Risk Score and Plan:   Airway Management Planned: Oral ETT  Additional Equipment:   Intra-op Plan:   Post-operative Plan:   Informed Consent: I have reviewed the patients History and Physical, chart, labs and discussed the procedure including the risks, benefits and alternatives for the proposed anesthesia with the patient or authorized representative who has indicated his/her understanding and acceptance.       Plan Discussed with: CRNA  Anesthesia Plan Comments:         Anesthesia Quick Evaluation

## 2018-10-30 NOTE — ED Provider Notes (Addendum)
[facility] Emergency Department Provider Note       Time seen: ----------------------------------------- 7:04 AM on 10/30/2018 -----------------------------------------   I have reviewed the triage vital signs and the nursing notes.  HISTORY   Chief Complaint Abdominal Pain    HPI Danielle Waters is a 28 y.o. female with a history of prediabetes, polycystic ovaries who presents to the ED for abdominal pain and vomiting with diarrhea.  Patient arrives by EMS.  Over the past week she has had cold and flulike symptoms.  She was seen in urgent care on Tuesday was told it was probably just a cold.  Tonight she began experiencing right-sided abdominal pain with nausea, vomiting, diarrhea and chills.  She complains of 10 out of 10 abdominal pain.  No past medical history on file.  Patient Active Problem List   Diagnosis Date Noted  . Pre-diabetes 09/04/2017  . PCOS (polycystic ovarian syndrome) 08/28/2017  . Obesity (BMI 30-39.9) 08/26/2017  . Transient hypertension 08/26/2017    No past surgical history on file.  Allergies Patient has no known allergies.  Social History Social History   Tobacco Use  . Smoking status: Never Smoker  . Smokeless tobacco: Never Used  Substance Use Topics  . Alcohol use: Yes  . Drug use: No   Review of Systems Constitutional: Negative for fever. Cardiovascular: Negative for chest pain. Respiratory: Positive for recent cough Gastrointestinal: Positive for abdominal pain, vomiting and diarrhea Musculoskeletal: Negative for back pain. Skin: Negative for rash. Neurological: Negative for headaches, focal weakness or numbness.  All systems negative/normal/unremarkable except as stated in the HPI  ____________________________________________   PHYSICAL EXAM:  VITAL SIGNS: ED Triage Vitals  Enc Vitals Group     BP 10/30/18 0645 (!) 160/83     Pulse Rate 10/30/18 0652 62     Resp 10/30/18 0652 12     Temp  10/30/18 0652 99.1 F (37.3 C)     Temp Source 10/30/18 0652 Oral     SpO2 10/30/18 0646 98 %     Weight 10/30/18 0653 170 lb (77.1 kg)     Height 10/30/18 0653 5\' 3"  (1.6 m)     Head Circumference --      Peak Flow --      Pain Score 10/30/18 0652 10     Pain Loc --      Pain Edu? --      Excl. in GC? --    Constitutional: Alert and oriented.  Mild distress Eyes: Conjunctivae are normal. Normal extraocular movements. ENT      Head: Normocephalic and atraumatic.      Nose: No congestion/rhinnorhea.      Mouth/Throat: Mucous membranes are moist.      Neck: No stridor. Cardiovascular: Slow rate, regular rhythm. No murmurs, rubs, or gallops. Respiratory: Normal respiratory effort without tachypnea nor retractions. Breath sounds are clear and equal bilaterally. No wheezes/rales/rhonchi. Gastrointestinal: Nonfocal abdominal tenderness, no rebound or guarding.  Normal bowel sounds. Musculoskeletal: Nontender with normal range of motion in extremities. No lower extremity tenderness nor edema. Neurologic:  Normal speech and language. No gross focal neurologic deficits are appreciated.  Skin:  Skin is warm, dry and intact. No rash noted. Psychiatric: Mood and affect are normal. Speech and behavior are normal.  ____________________________________________  ED COURSE:  As part of my medical decision making, I reviewed the following data within the electronic MEDICAL RECORD NUMBER History obtained from family if available, nursing notes, old chart and ekg, as well as  notes from prior ED visits. Patient presented for abdominal pain with vomiting and diarrhea, we will assess with labs and imaging as indicated at this time.   Procedures ____________________________________________   LABS (pertinent positives/negatives)  Labs Reviewed  COMPREHENSIVE METABOLIC PANEL - Abnormal; Notable for the following components:      Result Value   CO2 21 (*)    Glucose, Bld 114 (*)    All other components  within normal limits  URINALYSIS, COMPLETE (UACMP) WITH MICROSCOPIC - Abnormal; Notable for the following components:   Color, Urine YELLOW (*)    APPearance HAZY (*)    Ketones, ur 80 (*)    All other components within normal limits  INFLUENZA PANEL BY PCR (TYPE A & B) - Abnormal; Notable for the following components:   Influenza A By PCR POSITIVE (*)    All other components within normal limits  CBC WITH DIFFERENTIAL/PLATELET  LIPASE, BLOOD  TROPONIN I  POC URINE PREG, ED  POCT PREGNANCY, URINE  I-STAT BETA HCG BLOOD, ED ([facility], [facility], [facility] ONLY)    RADIOLOGY Images were viewed by me  CT the abdomen pelvis with contrast IMPRESSION: 1.  Findings felt to be indicative of acute appendicitis.  Appendix: Location: Right mid pelvis arising from the medial cecum and extending inferomedially.  Diameter: Up to 11 mm.  Appendicolith: 8 x 3 mm at base of the appendix.  Mucosal hyper-enhancement: Present  Extraluminal gas: None  Periappendiceal collection: Fluid tracking along the posterior appendix. No frank abscess.  2. Evidence of collapsed cyst in each ovary with focal paraovarian fluid bilaterally. Small amount of fluid in cul-de-sac.  3. No abscess in the abdomen or pelvis. No evident bowel obstruction.  4.  No appreciable renal or ureteral calculus.  No hydronephrosis.  5.  Hepatic steatosis.  6. 2 mm nodular opacity in the superior segment right lower lobe. No follow-up needed if patient is low-risk. Non-contrast chest CT can be considered in 12 months if patient is high-risk. This recommendation follows the consensus statement: Guidelines for Management of Incidental Pulmonary Nodules Detected on CT Images: From the Fleischner Society 2017; Radiology 2017; 284:228-243. ____________________________________________   DIFFERENTIAL DIAGNOSIS   Gastroenteritis, dehydration, electrolyte abnormality, influenza, dehydration, pregnancy  FINAL ASSESSMENT AND  PLAN  Influenza A, appendicitis   Plan: The patient had presented for domino pain with vomiting and diarrhea. Patient's labs did indicate influenza A positivity. Patient's imaging revealed acute appendicitis.  I will discuss with general surgery for evaluation and appendectomy.   Ulice Dash, MD    Note: This note was generated in part or whole with voice recognition software. Voice recognition is usually quite accurate but there are transcription errors that can and very often do occur. I apologize for any typographical errors that were not detected and corrected.     Emily Filbert, MD 10/30/18 1152    Emily Filbert, MD 10/30/18 1213

## 2018-10-30 NOTE — ED Notes (Signed)
SURGEON AT BEDSIDE.  

## 2018-10-30 NOTE — Progress Notes (Signed)
Dr. Aleen Campi messaged: Heart rate is still in the 40's. checked during admission to 2c and heart rate was 47. Rechecked HR is 48bpm.

## 2018-10-30 NOTE — Op Note (Signed)
  Procedure Date:  10/30/2018  Pre-operative Diagnosis:  Acute appendicitis  Post-operative Diagnosis:  Acute appendicitis  Procedure:  Laparoscopic appendectomy  Surgeon:  Howie Ill, MD  Anesthesia:  General endotracheal  Estimated Blood Loss:  10 ml  Specimens:  appendix  Complications:  None  Indications for Procedure:  This is a 28 y.o. female who presents with abdominal pain and workup revealing acute appendicitis.  The options of surgery versus observation were reviewed with the patient and/or family. The risks of bleeding, infection, recurrence of symptoms, negative laparoscopy, potential for an open procedure, bowel injury, abscess or infection, were all discussed with the patient and she was willing to proceed.  Description of Procedure: The patient was correctly identified in the preoperative area and brought into the operating room.  The patient was placed supine with VTE prophylaxis in place.  Appropriate time-outs were performed.  Anesthesia was induced and the patient was intubated.  Foley catheter was placed.  Appropriate antibiotics were infused.  The abdomen was prepped and draped in a sterile fashion. An infraumbilical incision was made. A cutdown technique was used to enter the abdominal cavity without injury, and a Hasson trocar was inserted.  Pneumoperitoneum was obtained with appropriate opening pressures.  Two 5-mm ports were placed in the suprapubic and left lateral positions under direct visualization.  The right lower quadrant was inspected and the appendix was identified and found to be acutely inflamed.  The appendix was carefully dissected.  The mesoappendix was divided using the LigaSure.  The base of the appendix was dissected out and divided with a standard load Endo GIA.  The appendix was placed in an Endocatch bag.  The right lower quadrant was then inspected again revealing an intact staple line, no bleeding, and no bowel injury.  There was small  volume of fluid in the pelvis which was suctioned.  The 5 mm ports were removed under direct visualization and the Hasson trocar was removed.  The Endocatch bag was brought out through the umbilical incision.  The fascial opening was closed using 0 vicryl suture.  Local anesthetic was infused in all incisions The umbilical incision was closed with 3-0 Vicryl and 4-0 Monocryl, and the two 5 mm incisions were closed with 4-0 Monocryl.  The wounds were cleaned and sealed with DermaBond.  Foley catheter was removed and the patient was emerged from anesthesia and extubated and brought to the recovery room for further management.  The patient tolerated the procedure well and all counts were correct at the end of the case.   Howie Ill, MD

## 2018-10-30 NOTE — Anesthesia Postprocedure Evaluation (Signed)
Anesthesia Post Note  Patient: Danielle Waters  Procedure(s) Performed: APPENDECTOMY LAPAROSCOPIC (N/A Abdomen)  Patient location during evaluation: PACU Anesthesia Type: General Level of consciousness: awake and alert Pain management: pain level controlled Vital Signs Assessment: post-procedure vital signs reviewed and stable Respiratory status: spontaneous breathing and respiratory function stable Cardiovascular status: stable Anesthetic complications: no     Last Vitals:  Vitals:   10/30/18 1645 10/30/18 1654  BP:  107/60  Pulse: (!) 46 (!) 47  Resp: (!) 21 18  Temp:  37.1 C  SpO2: 100% 100%    Last Pain:  Vitals:   10/30/18 1654  TempSrc: Oral  PainSc:                  Amos Gaber K

## 2018-10-30 NOTE — Anesthesia Procedure Notes (Addendum)
Procedure Name: Intubation Date/Time: 10/30/2018 1:54 PM Performed by: Rudean Hitt, CRNA Pre-anesthesia Checklist: Patient identified, Patient being monitored, Timeout performed, Emergency Drugs available and Suction available Patient Re-evaluated:Patient Re-evaluated prior to induction Oxygen Delivery Method: Circle system utilized Preoxygenation: Pre-oxygenation with 100% oxygen Induction Type: IV induction Laryngoscope Size: Mac and 3 Grade View: Grade I Tube type: Oral Tube size: 7.0 mm Number of attempts: 1 Airway Equipment and Method: Stylet Placement Confirmation: ETT inserted through vocal cords under direct vision,  positive ETCO2 and breath sounds checked- equal and bilateral Secured at: 21 cm Tube secured with: Tape Dental Injury: Teeth and Oropharynx as per pre-operative assessment

## 2018-10-30 NOTE — Anesthesia Post-op Follow-up Note (Signed)
Anesthesia QCDR form completed.        

## 2018-10-30 NOTE — H&P (Signed)
Date of Admission:  10/30/2018  Reason for Admission:  Acute appendicitis  History of Present Illness: Danielle Waters is a 28 y.o. female presenting with a one day history of malaise with abdominal pain, nausea and vomiting, and diarrhea.  Over the past week she has been having cold and flulike symptoms and went to urgent care earlier this week for further evaluation.  She was told it was probably just a cold.  Last night, she started having abdominal pain as well as nausea vomiting and diarrhea and presents emergency room this morning for further evaluation.  In the emergency room, she tested positive for influenza type a but given her persistent abdominal pain requiring doses of pain medication, she had a CT scan of the abdomen pelvis which showed acute appendicitis.  I have independently seen the patient's imaging study and agree with the findings with dilated appendix with an appendicolith at the base and mild periappendiceal stranding.  Past Medical History: Patient reports that when she was born, she had a history of seizures and required phenobarbital medication for 1 year.  She has not had any seizures since.  She also has a history of polycystic ovarian syndrome and prediabetes.  Past Surgical History: Reports D&C done in the past  Home Medications: Prior to Admission medications   Medication Sig Start Date End Date Taking? Authorizing Provider  Chlorphen-Phenyleph-ASA (ALKA-SELTZER PLUS COLD) 2-7.8-325 MG TBEF Take 2 tablets by mouth every 6 (six) hours as needed.   Yes [provider]  letrozole (FEMARA) 2.5 MG tablet Take 2.5 mg by mouth daily. 09/16/18   [provider]  metFORMIN (GLUCOPHAGE) 1000 MG tablet Take 1,000 mg by mouth daily. 09/16/18   [provider]    Allergies: No Known Allergies  Social History:  reports that she has never smoked. She has never used smokeless tobacco. She reports current alcohol use. She reports that she does not use  drugs.   Family History: Family History  Problem Relation Age of Onset  . Diabetes Mother   . Hypertension Mother   . Diabetes Maternal Grandmother   . Hypertension Maternal Grandmother     Review of Systems: Review of Systems  Constitutional: Positive for chills, fever and malaise/fatigue.  HENT: Negative for hearing loss.   Respiratory: Negative for shortness of breath.   Cardiovascular: Negative for chest pain.  Gastrointestinal: Positive for abdominal pain, diarrhea, nausea and vomiting.  Genitourinary: Negative for dysuria.  Musculoskeletal: Positive for myalgias.  Skin: Negative for rash.  Neurological: Negative for dizziness.  Psychiatric/Behavioral: Negative for depression.    Physical Exam BP 131/69   Pulse (!) 42   Temp 99.1 F (37.3 C) (Oral)   Resp 19   Ht 5\' 3"  (1.6 m)   Wt 77.1 kg   LMP 10/07/2018   SpO2 100%   BMI 30.11 kg/m  CONSTITUTIONAL: No acute distress HEENT:  Normocephalic, atraumatic, extraocular motion intact. NECK: Trachea is midline, and there is no jugular venous distension.  RESPIRATORY:  Lungs are clear, and breath sounds are equal bilaterally. Normal respiratory effort without pathologic use of accessory muscles. CARDIOVASCULAR: Heart is regular without murmurs, gallops, or rubs. GI: The abdomen is soft, obese, nondistended, with tenderness to palpation in the right lower quadrant at McBurney's point.  No diffuse peritonitis.   MUSCULOSKELETAL:  Normal muscle strength and tone in all four extremities.  No peripheral edema or cyanosis. SKIN: Skin turgor is normal. There are no pathologic skin lesions.  NEUROLOGIC:  Motor and sensation is  grossly normal.  Cranial nerves are grossly intact. PSYCH:  Alert and oriented to person, place and time. Affect is normal.  Laboratory Analysis: Results for orders placed or performed during the hospital encounter of 10/30/18 (from the past 24 hour(s))  CBC with Differential     Status: None    Collection Time: 10/30/18  6:58 AM  Result Value Ref Range   WBC 7.6 4.0 - 10.5 K/uL   RBC 4.35 3.87 - 5.11 MIL/uL   Hemoglobin 14.1 12.0 - 15.0 g/dL   HCT 16.1 09.6 - 04.5 %   MCV 95.6 80.0 - 100.0 fL   MCH 32.4 26.0 - 34.0 pg   MCHC 33.9 30.0 - 36.0 g/dL   RDW 40.9 81.1 - 91.4 %   Platelets 236 150 - 400 K/uL   nRBC 0.0 0.0 - 0.2 %   Neutrophils Relative % 75 %   Neutro Abs 5.7 1.7 - 7.7 K/uL   Lymphocytes Relative 18 %   Lymphs Abs 1.3 0.7 - 4.0 K/uL   Monocytes Relative 7 %   Monocytes Absolute 0.5 0.1 - 1.0 K/uL   Eosinophils Relative 0 %   Eosinophils Absolute 0.0 0.0 - 0.5 K/uL   Basophils Relative 0 %   Basophils Absolute 0.0 0.0 - 0.1 K/uL   Immature Granulocytes 0 %   Abs Immature Granulocytes 0.01 0.00 - 0.07 K/uL  Comprehensive metabolic panel     Status: Abnormal   Collection Time: 10/30/18  6:58 AM  Result Value Ref Range   Sodium 136 135 - 145 mmol/L   Potassium 3.9 3.5 - 5.1 mmol/L   Chloride 105 98 - 111 mmol/L   CO2 21 (L) 22 - 32 mmol/L   Glucose, Bld 114 (H) 70 - 99 mg/dL   BUN 9 6 - 20 mg/dL   Creatinine, Ser 7.82 0.44 - 1.00 mg/dL   Calcium 9.0 8.9 - 95.6 mg/dL   Total Protein 7.5 6.5 - 8.1 g/dL   Albumin 3.9 3.5 - 5.0 g/dL   AST 23 15 - 41 U/L   ALT 17 0 - 44 U/L   Alkaline Phosphatase 66 38 - 126 U/L   Total Bilirubin 0.7 0.3 - 1.2 mg/dL   GFR calc non Af Amer >60 >60 mL/min   GFR calc Af Amer >60 >60 mL/min   Anion gap 10 5 - 15  Lipase, blood     Status: None   Collection Time: 10/30/18  6:58 AM  Result Value Ref Range   Lipase 23 11 - 51 U/L  Troponin I - Once     Status: None   Collection Time: 10/30/18  6:58 AM  Result Value Ref Range   Troponin I <0.03 <0.03 ng/mL  Urinalysis, Complete w Microscopic     Status: Abnormal   Collection Time: 10/30/18  7:16 AM  Result Value Ref Range   Color, Urine YELLOW (A) YELLOW   APPearance HAZY (A) CLEAR   Specific Gravity, Urine 1.024 1.005 - 1.030   pH 6.0 5.0 - 8.0   Glucose, UA NEGATIVE  NEGATIVE mg/dL   Hgb urine dipstick NEGATIVE NEGATIVE   Bilirubin Urine NEGATIVE NEGATIVE   Ketones, ur 80 (A) NEGATIVE mg/dL   Protein, ur NEGATIVE NEGATIVE mg/dL   Nitrite NEGATIVE NEGATIVE   Leukocytes, UA NEGATIVE NEGATIVE   RBC / HPF 11-20 0 - 5 RBC/hpf   WBC, UA 6-10 0 - 5 WBC/hpf   Bacteria, UA NONE SEEN NONE SEEN   Squamous Epithelial / LPF  0-5 0 - 5   Mucus PRESENT   Influenza panel by PCR (type A & B)     Status: Abnormal   Collection Time: 10/30/18  7:16 AM  Result Value Ref Range   Influenza A By PCR POSITIVE (A) NEGATIVE   Influenza B By PCR NEGATIVE NEGATIVE  Pregnancy, urine POC     Status: None   Collection Time: 10/30/18 11:15 AM  Result Value Ref Range   Preg Test, Ur NEGATIVE NEGATIVE    Imaging: Ct Abdomen Pelvis W Contrast  Result Date: 10/30/2018 CLINICAL DATA:  Abdominal pain and diarrhea EXAM: CT ABDOMEN AND PELVIS WITH CONTRAST TECHNIQUE: Multidetector CT imaging of the abdomen and pelvis was performed using the standard protocol following bolus administration of intravenous contrast. CONTRAST:  100mL OMNIPAQUE IOHEXOL 300 MG/ML  SOLN COMPARISON:  None. FINDINGS: Lower chest: There is no lung base edema or consolidation. On axial slice 1 series 4, there is a 2 mm nodular opacity in the superior segment right lower lobe. Hepatobiliary: There is a degree of hepatic steatosis. No focal liver lesions are appreciable. Gallbladder wall is not appreciably thickened. There is no biliary duct dilatation. Pancreas: No pancreatic mass or inflammatory focus. Spleen: No splenic lesions are evident. Adrenals/Urinary Tract: Adrenals bilaterally appear unremarkable. Kidneys bilaterally show no evident mass or hydronephrosis on either side. There is no renal or ureteral calculus on either side. Urinary bladder is midline with wall thickness within normal limits. Stomach/Bowel: There is no appreciable bowel wall or mesenteric thickening. There is no evident bowel obstruction. There  is no free air or portal venous air. Vascular/Lymphatic: There is no abdominal aortic aneurysm. No evident vascular lesions. There is no adenopathy in the abdomen or pelvis. Reproductive: The uterus is anteverted. There is a collapsed cyst in each ovary with surrounding paraovarian fluid on each side the ovarian cyst remnant on the right with wall enhancement measures 1.7 x 1.7 cm. The collapsed cyst remnant on the left measures 1.4 x 1.4 cm. There is minimal free fluid in the cul-de-sac. Other: There is appendiceal dilatation with a maximum transverse diameter of 11 mm. There is enhancement of much of the wall of the appendix. There is a focal appendicolith in the base of the appendix measuring 8 x 3 mm. There is mild stranding and fluid tracking along the appendix. No frank abscess or perforation. No abscess is seen in the abdomen or pelvis. There is no ascites beyond the mild fluid seen in the cul-de-sac region. Musculoskeletal: There are no blastic or lytic bone lesions. There is no intramuscular or abdominal wall lesion evident. IMPRESSION: 1.  Findings felt to be indicative of acute appendicitis. Appendix: Location: Right mid pelvis arising from the medial cecum and extending inferomedially. Diameter: Up to 11 mm. Appendicolith: 8 x 3 mm at base of the appendix. Mucosal hyper-enhancement: Present Extraluminal gas: None Periappendiceal collection: Fluid tracking along the posterior appendix. No frank abscess. 2. Evidence of collapsed cyst in each ovary with focal paraovarian fluid bilaterally. Small amount of fluid in cul-de-sac. 3. No abscess in the abdomen or pelvis. No evident bowel obstruction. 4.  No appreciable renal or ureteral calculus.  No hydronephrosis. 5.  Hepatic steatosis. 6. 2 mm nodular opacity in the superior segment right lower lobe. No follow-up needed if patient is low-risk. Non-contrast chest CT can be considered in 12 months if patient is high-risk. This recommendation follows the  consensus statement: Guidelines for Management of Incidental Pulmonary Nodules Detected on CT Images: From the  Fleischner Society 2017; Radiology 2017; [phone]. Critical Value/emergent results were called by telephone at the time of interpretation on 10/30/2018 at 11:48 am to Dr. Daryel November , who verbally acknowledged these results. Electronically Signed   By: Bretta Bang III M.D.   On: 10/30/2018 11:48    Assessment and Plan: This is a 29 y.o. female with influenza type a as well as acute appendicitis.  Discussed with the patient that her appendix does appear inflamed and distended on her CAT scan and despite of her normal lab work other than being positive for the flu, we would recommend surgery for her appendix.  Discussed with her that although she does have the flu, we should not wait until that is better in order to do her appendix.  At this point we would recommend a laparoscopic appendectomy.  Discussed with her the risks of bleeding, infection, injury to surrounding structures, as well as the potential risk for an open procedure.  She understands these risks and is willing to proceed.  She has been added on to the schedule for today and should go relatively soon.  Will be given IV Zosyn in the emergency room and continued postop.  She will be n.p.o. for now with IV fluid hydration appropriate pain and nausea control.   Howie Ill, MD [facility] Pg:  [phone]

## 2018-10-30 NOTE — ED Notes (Signed)
Patient transported to CT 

## 2018-10-30 NOTE — ED Notes (Signed)
Surgical consent signed

## 2018-10-31 ENCOUNTER — Encounter: Payer: Self-pay | Admitting: Surgery

## 2018-10-31 MED ORDER — IBUPROFEN 800 MG PO TABS: 800.0000 mg | ORAL_TABLET | Freq: Three times a day (TID) | ORAL | 0 refills | Status: DC | PRN

## 2018-10-31 MED ORDER — OXYCODONE HCL 5 MG PO TABS: 5.0000 mg | ORAL_TABLET | Freq: Four times a day (QID) | ORAL | 0 refills | Status: DC | PRN

## 2018-10-31 NOTE — Progress Notes (Signed)
Nutrition Brief Note  Patient identified on the Malnutrition Screening Tool (MST) Report  28 y/o female s/p laparoscopic appendectomy 1/16  Wt Readings from Last 15 Encounters:  10/30/18 77.1 kg  06/30/18 88.6 kg  02/06/18 93.9 kg  08/26/17 91.6 kg  03/08/17 91.2 kg  11/06/16 76.2 kg    Body mass index is 30.11 kg/m. Patient meets criteria for obesity based on current BMI.   Current diet order is GI soft, patient is consuming approximately 80% of meals at this time. Labs and medications reviewed.   Pt to discharge today   No nutrition interventions warranted at this time. If nutrition issues arise, please consult RD.   Betsey Holiday MS, RD, LDN Pager #- [phone] Office#- [phone] After Hours Pager: [phone]

## 2018-10-31 NOTE — Discharge Summary (Signed)
[facility] SURGICAL DISCHARGE SUMMARY   Patient ID: Danielle ReedyCourtney Waters MRN: [id number] DOB/AGE: 30/11/1990 30 y.o.  Admit date: 10/30/2018 Discharge date: 10/31/2018  Discharge Diagnoses Patient Active Problem List   Diagnosis Date Noted  . Acute appendicitis   . Influenza A   . Pre-diabetes 09/04/2017  . PCOS (polycystic ovarian syndrome) 08/28/2017  . Obesity (BMI 30-39.9) 08/26/2017  . Transient hypertension 08/26/2017    Consultants None  Procedures -- 10/30/2018:  Laparoscopic Appendectomy   HPI: Danielle ReedyCourtney Parkin is a 28 y.o. female presenting with a one day history of malaise with abdominal pain, nausea and vomiting, and diarrhea.  Over the past week she has been having cold and flulike symptoms and went to urgent care earlier this week for further evaluation.  She was told it was probably just a cold.  Last night, she started having abdominal pain as well as nausea vomiting and diarrhea and presents emergency room this morning for further evaluation.  In the emergency room, she tested positive for influenza type a but given her persistent abdominal pain requiring doses of pain medication, she had a CT scan of the abdomen pelvis which showed acute appendicitis.  I have independently seen the patient's imaging study and agree with the findings with dilated appendix with an appendicolith at the base and mild periappendiceal stranding  Hospital Course: Informed consent was obtained and documented, and patient underwent uneventful laparoscopic appendectomy (Dr Henrene DodgeJose PIscoya, MD, 10/30/2018).  Post-operatively, patient's pain improved/resolved and advancement of patient's diet and ambulation were well-tolerated. The remainder of patient's hospital course was essentially unremarkable, and discharge planning was initiated accordingly with patient safely able to be discharged home with appropriate discharge instructions, pain control, and outpatient follow-up after all of her  questions were answered to her expressed satisfaction.  Discharge Condition: Good   Physical Examination:  Constitutional: Well appearing female, NAD Pulmonary: Normal effort, no respiratory distress Gastrointestinal: Soft, incisional tenderness, non-distended  Skin: Laparoscopic incisions are CDI, no erythema, no drainage   Allergies as of 10/31/2018   No Known Allergies     Medication List    TAKE these medications   ALKA-SELTZER PLUS COLD 2-7.8-325 MG Tbef Generic drug:  Chlorphen-Phenyleph-ASA Take 2 tablets by mouth every 6 (six) hours as needed.   ibuprofen 800 MG tablet Commonly known as:  ADVIL,MOTRIN Take 1 tablet (800 mg total) by mouth every 8 (eight) hours as needed.   letrozole 2.5 MG tablet Commonly known as:  FEMARA Take 2.5 mg by mouth daily.   metFORMIN 1000 MG tablet Commonly known as:  GLUCOPHAGE Take 1,000 mg by mouth daily.   oxyCODONE 5 MG immediate release tablet Commonly known as:  Oxy IR/ROXICODONE Take 1 tablet (5 mg total) by mouth every 6 (six) hours as needed for severe pain or breakthrough pain.        Follow-up Information    Piscoya, Elita QuickJose, MD. Schedule an appointment as soon as possible for a visit in 2 week(s).   Specialty:  General Surgery Why:  s/p lap appy Contact information: [address] [place] [address] [phone]            -- Lynden OxfordZachary Korion Cuevas , PA-C [facility]  10/31/2018, 8:58 AM [phone] M-F: 7am - 4pm

## 2018-10-31 NOTE — Progress Notes (Signed)
Per MD okay for RN to Dc iv fluids.

## 2018-10-31 NOTE — Progress Notes (Signed)
Danielle Waters  A and O x 4. VSS. Pt tolerating diet well. No complaints of pain or nausea. IV removed intact, prescriptions given. Pt voiced understanding of discharge instructions with no further questions. Pt discharged via wheelchair with NT.  Allergies as of 10/31/2018   No Known Allergies     Medication List    TAKE these medications   ALKA-SELTZER PLUS COLD 2-7.8-325 MG Tbef Generic drug:  Chlorphen-Phenyleph-ASA Take 2 tablets by mouth every 6 (six) hours as needed.   ibuprofen 800 MG tablet Commonly known as:  ADVIL,MOTRIN Take 1 tablet (800 mg total) by mouth every 8 (eight) hours as needed.   letrozole 2.5 MG tablet Commonly known as:  FEMARA Take 2.5 mg by mouth daily.   metFORMIN 1000 MG tablet Commonly known as:  GLUCOPHAGE Take 1,000 mg by mouth daily.   oxyCODONE 5 MG immediate release tablet Commonly known as:  Oxy IR/ROXICODONE Take 1 tablet (5 mg total) by mouth every 6 (six) hours as needed for severe pain or breakthrough pain.       Vitals:   10/30/18 2013 10/31/18 0533  BP: (!) 104/52 109/64  Pulse: (!) 48 (!) 41  Resp: 18 20  Temp: 98.5 F (36.9 C) 98 F (36.7 C)  SpO2: 100% 100%    Suzzanne Cloud

## 2018-10-31 NOTE — Discharge Instructions (Signed)
In addition to included general post-operative instructions for laparoscopic appendectomy (appendix removal),  Diet: Resume home diet.   Activity: No heavy lifting >20 pounds (children, pets, laundry, garbage) or strenuous activity until follow-up, but light activity and walking are encouraged. Do not drive or drink alcohol if taking narcotic pain medications.  Wound care: 2 days after surgery (11/01/2018), you may shower/get incision wet with soapy water and pat dry (do not rub incisions), but no baths or submerging incision underwater until follow-up.   Medications: Resume all home medications. For mild to moderate pain: acetaminophen (Tylenol) or ibuprofen/naproxen (if no kidney disease). Combining Tylenol with alcohol can substantially increase your risk of causing liver disease. Narcotic pain medications, if prescribed, can be used for severe pain, though may cause nausea, constipation, and drowsiness. Do not combine Tylenol and Percocet (or similar) within a 6 hour period as Percocet (and similar) contain(s) Tylenol. If you do not need the narcotic pain medication, you do not need to fill the prescription.  Call office [phone] / [phone]) at any time if any questions, worsening pain, fevers/chills, bleeding, drainage from incision site, or other concerns.

## 2018-11-03 LAB — SURGICAL PATHOLOGY

## 2018-11-14 ENCOUNTER — Encounter: Payer: Self-pay | Admitting: Surgery

## 2018-11-14 ENCOUNTER — Other Ambulatory Visit: Payer: Self-pay

## 2018-11-14 ENCOUNTER — Ambulatory Visit (INDEPENDENT_AMBULATORY_CARE_PROVIDER_SITE_OTHER): Payer: BLUE CROSS/BLUE SHIELD | Admitting: Surgery

## 2018-11-14 VITALS — BP 127/79 | HR 66 | Temp 97.5°F | Ht 63.0 in | Wt 172.0 lb

## 2018-11-14 DIAGNOSIS — Z09 Encounter for follow-up examination after completed treatment for conditions other than malignant neoplasm: Secondary | ICD-10-CM

## 2018-11-14 DIAGNOSIS — K3580 Unspecified acute appendicitis: Secondary | ICD-10-CM

## 2018-11-14 NOTE — Patient Instructions (Addendum)
Patient will need to return to the office as needed, take miralax for the next few days call the office immediately if you start having nausea and vomiting.   Call the office with any questions or concerns.

## 2018-11-14 NOTE — Progress Notes (Signed)
11/14/2018  HPI: Barbera Hinze is a 28 y.o. female s/p laparoscopic appendectomy on 10/30/18. Presents today for follow up.  Reports having pain at the umbilical incision depending on type of motion she does.  Reports having initial constipation requiring a laxative, with no BM for 7 days.  Now reports having some mild nausea after eating, but no emesis.  Vital signs: BP 127/79   Pulse 66   Temp (!) 97.5 F (36.4 C) (Temporal)   Ht 5\' 3"  (1.6 m)   Wt 172 lb (78 kg)   SpO2 100%   BMI 30.47 kg/m    Physical Exam: Constitutional: No acute distress Abdomen:  Soft, non-distended, with some tenderness over umbilical incision.  Incisions are clean, dry, intact, without any evidence of infection.    Assessment/Plan: This is a 28 y.o. female s/p laparoscopic appendectomy.  --Pathology reviewed with patient.  Acute appendicitis without evidence of malignancy. --Discussed with patient that the discomfort at the umbilical incision is not uncommon because of the sutures that are placed to close the fascia.  This should continue to improve as the healing process continues and the sutures dissolve.   --The nausea may be related to the constipation that the patient is having.  Recommended that she take Miralax daily for a few days to help empty her intestines.  If she continues having nausea and if it progresses to emesis, without any BM and without any flatus, then she should call us for evaluation and possible admission if she develops ileus.  Patient understands this plan and all of her questions have been answered. --Otherwise follow up prn.   Howie Ill, MD  [facility]

## 2018-12-24 ENCOUNTER — Encounter: Payer: Self-pay | Admitting: Advanced Practice Midwife

## 2018-12-24 ENCOUNTER — Ambulatory Visit (INDEPENDENT_AMBULATORY_CARE_PROVIDER_SITE_OTHER): Payer: BLUE CROSS/BLUE SHIELD | Admitting: Advanced Practice Midwife

## 2018-12-24 ENCOUNTER — Other Ambulatory Visit: Payer: Self-pay

## 2018-12-24 VITALS — BP 106/72 | HR 72 | Wt 178.1 lb

## 2018-12-24 DIAGNOSIS — Z3201 Encounter for pregnancy test, result positive: Secondary | ICD-10-CM | POA: Diagnosis not present

## 2018-12-24 DIAGNOSIS — Z789 Other specified health status: Secondary | ICD-10-CM | POA: Diagnosis not present

## 2018-12-24 NOTE — Patient Instructions (Signed)

## 2018-12-24 NOTE — Progress Notes (Signed)
Patient reports going to ER on 12/21/2018 for constipation. They perform a pregnancy test and it was positive. Patient has concerns

## 2018-12-24 NOTE — Progress Notes (Addendum)
History:   Johnanna Gaona is a 28 y.o. G1P0 who presents to clinic for pregnancy confirmation. She endorses LMP of 10/07/2018 but had a negative pregnancy test prior to her laparoscopic appendectomy in January 2020. Patient reports recent episodes of constipation, nausea and vomiting for which he was triaged at Patient reports recent ED visit for constipation and nausea. She was prescribed Diclegis and Metamucil. She states these medications are managing her symptoms well.  Patient's history is significant for PCOS for which she was prescribed Metformin, as well as pregnancy loss and DNC in 05/2018.   No past medical history on file. Past Surgical History:  Procedure Laterality Date  . LAPAROSCOPIC APPENDECTOMY N/A 10/30/2018   Procedure: APPENDECTOMY LAPAROSCOPIC;  Surgeon: Henrene Dodge, MD;  Location: [facility] ORS;  Service: General;  Laterality: N/A;   Family History  Problem Relation Age of Onset  . Diabetes Mother   . Hypertension Mother   . Diabetes Maternal Grandmother   . Hypertension Maternal Grandmother    Social History   Tobacco Use  . Smoking status: Never Smoker  . Smokeless tobacco: Never Used  Substance Use Topics  . Alcohol use: Yes  . Drug use: No   No Known Allergies Current Outpatient Medications on File Prior to Visit  Medication Sig Dispense Refill  . Chlorphen-Phenyleph-ASA (ALKA-SELTZER PLUS COLD) 2-7.8-325 MG TBEF Take 2 tablets by mouth every 6 (six) hours as needed.    . Doxylamine-Pyridoxine 10-10 MG TBEC Take by mouth.    Marland Kitchen ibuprofen (ADVIL,MOTRIN) 800 MG tablet Take 1 tablet (800 mg total) by mouth every 8 (eight) hours as needed. (Patient not taking: Reported on 12/24/2018) 30 tablet 0  . letrozole (FEMARA) 2.5 MG tablet Take 2.5 mg by mouth daily.    Marland Kitchen METAMUCIL 0.52 g capsule     . metFORMIN (GLUCOPHAGE) 1000 MG tablet Take 1,000 mg by mouth daily.    Marland Kitchen oxyCODONE (OXY IR/ROXICODONE) 5 MG immediate release tablet Take 1 tablet (5 mg total) by mouth  every 6 (six) hours as needed for severe pain or breakthrough pain. (Patient not taking: Reported on 12/24/2018) 20 tablet 0   No current facility-administered medications on file prior to visit.     Review of Systems Pertinent items noted in HPI and remainder of comprehensive ROS otherwise negative. Physical Exam:   Vitals:   12/24/18 0938  BP: 106/72  Pulse: 72  Weight: 178 lb 1.6 oz (80.8 kg)     System: General: well-developed, well-nourished female in no acute distress   Skin: normal coloration and turgor, no rashes   Neurologic: oriented, normal, negative, normal mood   Extremities: normal strength, tone, and muscle mass, ROM of all joints is normal   HEENT PERRLA, extraocular movement intact and sclera clear, anicteric   Mouth/Teeth mucous membranes moist, pharynx normal without lesions and dental hygiene good   Neck supple and no masses   Cardiovascular: regular rate and rhythm   Respiratory:  no respiratory distress    Assessment:    Pregnancy: G1P0 Patient Active Problem List   Diagnosis Date Noted  . Acute appendicitis   . Influenza A   . Pre-diabetes 09/04/2017  . PCOS (polycystic ovarian syndrome) 08/28/2017  . Obesity (BMI 30-39.9) 08/26/2017  . Transient hypertension 08/26/2017     Plan:    1. Pregnancy test performed, pregnancy confirmed - Positive pregnancy test, uncertain LMP. Patient to have dating/viability scan then schedule New OB - Reviewed safe medications in pregnancy - US OB Transvaginal; Future -  US OB Comp Less 14 Wks; Future   2. Date of last menstrual period (LMP) unknown - Beta hCG quant (ref lab)  Greater than 50% of 20 minute visit spent in counseling and coordination of care  Clayton Bibles Certified Nurse Midwife Center for [employer], [facility]

## 2018-12-25 LAB — BETA HCG QUANT (REF LAB): hCG Quant: 43050 m[IU]/mL

## 2019-01-01 ENCOUNTER — Ambulatory Visit (HOSPITAL_COMMUNITY)
Admission: RE | Admit: 2019-01-01 | Discharge: 2019-01-01 | Disposition: A | Discharge status: Home / Self Care | Payer: BLUE CROSS/BLUE SHIELD | Source: Ambulatory Visit | Attending: Advanced Practice Midwife | Admitting: Advanced Practice Midwife

## 2019-01-01 ENCOUNTER — Other Ambulatory Visit: Payer: Self-pay

## 2019-01-01 DIAGNOSIS — Z3201 Encounter for pregnancy test, result positive: Secondary | ICD-10-CM | POA: Diagnosis present

## 2019-01-12 ENCOUNTER — Ambulatory Visit (INDEPENDENT_AMBULATORY_CARE_PROVIDER_SITE_OTHER): Payer: BLUE CROSS/BLUE SHIELD | Admitting: Obstetrics & Gynecology

## 2019-01-12 ENCOUNTER — Encounter: Payer: Self-pay | Admitting: Obstetrics & Gynecology

## 2019-01-12 VITALS — BP 123/77 | HR 56 | Wt 178.0 lb

## 2019-01-12 DIAGNOSIS — Z3A14 14 weeks gestation of pregnancy: Secondary | ICD-10-CM

## 2019-01-12 DIAGNOSIS — O0990 Supervision of high risk pregnancy, unspecified, unspecified trimester: Secondary | ICD-10-CM | POA: Insufficient documentation

## 2019-01-12 DIAGNOSIS — Z3689 Encounter for other specified antenatal screening: Secondary | ICD-10-CM

## 2019-01-12 DIAGNOSIS — Z3481 Encounter for supervision of other normal pregnancy, first trimester: Secondary | ICD-10-CM

## 2019-01-12 DIAGNOSIS — Z3480 Encounter for supervision of other normal pregnancy, unspecified trimester: Secondary | ICD-10-CM

## 2019-01-12 NOTE — Progress Notes (Signed)
Patient was very emotional during her visit, wanted an ultrasound from the check in point but was informed she had a formal one on 01/01/2019 and there was no indication for one. Normal FHR 140 bpm.  Patient says that she wanted a picture, she was offered a chance to view the images on the computer and take pictures with her phone.She was informed that the ultrasound was reassuring.  I informed her and tried to reassure her of the normal heart rate, also talked about different tests that will be done today to ensure all was well and even talked her into getting NIPS. She maintained that I am not making her "feel safe" this pregnancy because she did not get an ultrasound this visit as she "was promised". She felt that I did not care, and that I was condescending to her which I was baffled by, as I and everyone here was very nice to her from the beginning of the encounter. She reports that she had a miscarriage last pregnancy, wants to ensure all was well. She said she liked the midwife she saw before at previous visit ([place], [place]) and got up and stormed off. She was offered a rescheduled appointment with Thalia Bloodgood on her way out, but she was crying and very upset and did not pay any attention to anything I said.  I was sure that a NEW OB appointment was made for the patient with Thalia Bloodgood; this will be on 01/21/2019.  Jaynie Collins, MD, FACOG Obstetrician & Gynecologist, [facility] for [employer], [facility]

## 2019-01-12 NOTE — Patient Instructions (Signed)

## 2019-01-14 ENCOUNTER — Other Ambulatory Visit: Payer: Self-pay

## 2019-01-14 ENCOUNTER — Encounter: Payer: Self-pay | Admitting: Family Medicine

## 2019-01-14 ENCOUNTER — Ambulatory Visit (INDEPENDENT_AMBULATORY_CARE_PROVIDER_SITE_OTHER): Payer: BLUE CROSS/BLUE SHIELD | Admitting: Family Medicine

## 2019-01-14 VITALS — BP 112/71 | HR 65 | Wt 179.0 lb

## 2019-01-14 DIAGNOSIS — O09299 Supervision of pregnancy with other poor reproductive or obstetric history, unspecified trimester: Secondary | ICD-10-CM

## 2019-01-14 DIAGNOSIS — N760 Acute vaginitis: Secondary | ICD-10-CM | POA: Diagnosis not present

## 2019-01-14 DIAGNOSIS — Z029 Encounter for administrative examinations, unspecified: Secondary | ICD-10-CM

## 2019-01-14 DIAGNOSIS — B9689 Other specified bacterial agents as the cause of diseases classified elsewhere: Secondary | ICD-10-CM

## 2019-01-14 DIAGNOSIS — N898 Other specified noninflammatory disorders of vagina: Secondary | ICD-10-CM

## 2019-01-14 DIAGNOSIS — Z113 Encounter for screening for infections with a predominantly sexual mode of transmission: Secondary | ICD-10-CM

## 2019-01-14 DIAGNOSIS — O0990 Supervision of high risk pregnancy, unspecified, unspecified trimester: Secondary | ICD-10-CM

## 2019-01-14 DIAGNOSIS — O99282 Endocrine, nutritional and metabolic diseases complicating pregnancy, second trimester: Secondary | ICD-10-CM

## 2019-01-14 DIAGNOSIS — E282 Polycystic ovarian syndrome: Secondary | ICD-10-CM

## 2019-01-14 DIAGNOSIS — Z124 Encounter for screening for malignant neoplasm of cervix: Secondary | ICD-10-CM

## 2019-01-14 DIAGNOSIS — O99210 Obesity complicating pregnancy, unspecified trimester: Secondary | ICD-10-CM | POA: Insufficient documentation

## 2019-01-14 DIAGNOSIS — Z3A14 14 weeks gestation of pregnancy: Secondary | ICD-10-CM

## 2019-01-14 DIAGNOSIS — O23592 Infection of other part of genital tract in pregnancy, second trimester: Secondary | ICD-10-CM

## 2019-01-14 DIAGNOSIS — Z20828 Contact with and (suspected) exposure to other viral communicable diseases: Secondary | ICD-10-CM

## 2019-01-14 DIAGNOSIS — O09292 Supervision of pregnancy with other poor reproductive or obstetric history, second trimester: Secondary | ICD-10-CM

## 2019-01-14 DIAGNOSIS — O99212 Obesity complicating pregnancy, second trimester: Secondary | ICD-10-CM

## 2019-01-14 NOTE — Patient Instructions (Signed)

## 2019-01-14 NOTE — Progress Notes (Signed)
   INITIAL PRENATAL VISIT NOTE  Subjective:  Danielle Waters is a 28 y.o. G2P0010 at [redacted]w[redacted]d being seen today for ongoing prenatal care.  She is currently monitored for the following issues for this high risk pregnancy and has Obesity (BMI 30-39.9); Transient hypertension; PCOS (polycystic ovarian syndrome); Pre-diabetes; Supervision of high risk pregnancy, antepartum; and Obesity affecting pregnancy, antepartum on their problem list.  Patient reports no complaints.  Contractions: Not present. Vag. Bleeding: None.   . Denies leaking of fluid.   Too late for NT screening, desires NIPT  The following portions of the patient's history were reviewed and updated as appropriate: allergies, current medications, past family history, past medical history, past social history, past surgical history and problem list.   Objective:   Vitals:   01/14/19 0830  BP: 112/71  Pulse: 65  Weight: 179 lb (81.2 kg)    Fetal Status: Fetal Heart Rate (bpm): 146         General:  Alert, oriented and cooperative. Patient is in no acute distress.  Skin: Skin is warm and dry. No rash noted.   Cardiovascular: Normal heart rate noted  Respiratory: Normal respiratory effort, no problems with respiration noted  Abdomen: Soft, gravid, appropriate for gestational age.  Pain/Pressure: Absent     Pelvic: Cervical exam performed Dilation: Closed    - normal adnexa. Normal appearing cervix and vaginal mucosa. + white discharge with reports increased odor by patient.   Extremities: Normal range of motion.     Mental Status: Normal mood and affect. Normal behavior. Normal judgment and thought content.   Breasts: breasts appear normal, no suspicious masses, no skin or nipple changes or axillary nodes.  Assessment and Plan:  Pregnancy: G2P0010 at [redacted]w[redacted]d  1. Obesity affecting pregnancy, antepartum - recommended 11-15lbs weight gain - Hemoglobin A1c - TSH  2. Supervision of high risk pregnancy, antepartum - Reviewed  multidisciplinary care and nature of [facility] service - Reviewed typical prenatal care schedule and delivery facility - Partner with HSV 1 on mouth. Patient without lesions- request screening. Obtained HSV on pap and will get antibodies. Explained limitations to testing and that best test is culture of a lesion. Patient voiced understanding.  - [place] MFM OB COMP + 14 WK; Future - Genetic Screening - Obstetric Panel, Including HIV - Cytology - PAP - Hemoglobin A1c - Discussed babyscripts and patient would like to participate. Reviewed changes in routine prenatal care schedule and overall changes in typical schedule given Coronavirus   3. PCOS (polycystic ovarian syndrome) - Hemoglobin A1c  4. History of miscarriage, currently pregnant - Provided support today, patient is rightly very nervous and still processing Aug 2019 miscarriage - TSH  Preterm labor symptoms and general obstetric precautions including but not limited to vaginal bleeding, contractions, leaking of fluid and fetal movement were reviewed in detail with the patient. Please refer to After Visit Summary for other counseling recommendations.   Return in about 4 weeks (around 02/11/2019) for Routine prenatal care, after anatomy scan .  Future Appointments  Date Time Provider Department Center  02/11/2019  1:15 PM [facility] [place] 4 [facility]  02/12/2019  8:15 AM [place] Bing, MD [facility]    Federico Flake, MD

## 2019-01-15 LAB — OBSTETRIC PANEL, INCLUDING HIV
Antibody Screen: NEGATIVE
Basophils Absolute: 0 10*3/uL (ref 0.0–0.2)
Basos: 0 %
EOS (ABSOLUTE): 0 10*3/uL (ref 0.0–0.4)
Eos: 0 %
HIV Screen 4th Generation wRfx: NONREACTIVE
Hematocrit: 33.2 % — ABNORMAL LOW (ref 34.0–46.6)
Hemoglobin: 11.9 g/dL (ref 11.1–15.9)
Hepatitis B Surface Ag: NEGATIVE
Immature Grans (Abs): 0.1 10*3/uL (ref 0.0–0.1)
Immature Granulocytes: 1 %
Lymphocytes Absolute: 1.8 10*3/uL (ref 0.7–3.1)
Lymphs: 16 %
MCH: 34.6 pg — ABNORMAL HIGH (ref 26.6–33.0)
MCHC: 35.8 g/dL — ABNORMAL HIGH (ref 31.5–35.7)
MCV: 97 fL (ref 79–97)
Monocytes Absolute: 0.6 10*3/uL (ref 0.1–0.9)
Monocytes: 6 %
Neutrophils Absolute: 8.4 10*3/uL — ABNORMAL HIGH (ref 1.4–7.0)
Neutrophils: 77 %
Platelets: 268 10*3/uL (ref 150–450)
RBC: 3.44 x10E6/uL — ABNORMAL LOW (ref 3.77–5.28)
RDW: 12.8 % (ref 11.7–15.4)
RPR Ser Ql: NONREACTIVE
Rh Factor: POSITIVE
Rubella Antibodies, IGG: 2.22 index (ref >0.99)
WBC: 10.9 10*3/uL — ABNORMAL HIGH (ref 3.4–10.8)

## 2019-01-15 LAB — HSV-2 IGG SUPPLEMENTAL TEST: HSV-2 IgG Supplemental Test: NEGATIVE

## 2019-01-15 LAB — TSH: TSH: 1.98 u[IU]/mL (ref 0.450–4.500)

## 2019-01-15 LAB — CERVICOVAGINAL ANCILLARY ONLY
Bacterial vaginitis: POSITIVE — AB
Chlamydia: NEGATIVE
Neisseria Gonorrhea: NEGATIVE

## 2019-01-15 LAB — HSV(HERPES SMPLX)ABS-I+II(IGG+IGM)-BLD
HSV 1 Glycoprotein G Ab, IgG: <0.91 {index} (ref 0.00–0.90)
HSV 2 IgG, Type Spec: 1.77 {index} — ABNORMAL HIGH (ref 0.00–0.90)
HSVI/II Comb IgM: <0.91 ratio (ref 0.00–0.90)

## 2019-01-15 LAB — CYTOLOGY - PAP: Diagnosis: NEGATIVE

## 2019-01-15 LAB — HEMOGLOBIN A1C
Est. average glucose Bld gHb Est-mCnc: 105 mg/dL
Hgb A1c MFr Bld: 5.3 % (ref 4.8–5.6)

## 2019-01-16 LAB — URINE CULTURE, OB REFLEX

## 2019-01-16 LAB — CULTURE, OB URINE

## 2019-01-19 DIAGNOSIS — Z20828 Contact with and (suspected) exposure to other viral communicable diseases: Secondary | ICD-10-CM | POA: Insufficient documentation

## 2019-01-19 MED ORDER — METRONIDAZOLE 250 MG PO TABS: 250.0000 mg | ORAL_TABLET | Freq: Two times a day (BID) | ORAL | 0 refills | Status: DC

## 2019-01-19 NOTE — Addendum Note (Signed)
Addended by: Geanie Berlin on: 01/19/2019 01:26 PM   Modules accepted: Orders

## 2019-01-21 ENCOUNTER — Encounter: Payer: BLUE CROSS/BLUE SHIELD | Admitting: Advanced Practice Midwife

## 2019-01-21 ENCOUNTER — Encounter: Payer: Self-pay | Admitting: Radiology

## 2019-01-21 ENCOUNTER — Telehealth: Payer: Self-pay

## 2019-01-21 ENCOUNTER — Telehealth: Payer: Self-pay | Admitting: Radiology

## 2019-01-21 NOTE — Telephone Encounter (Signed)
Call patient to inform her of panorama results. She stated someone from the office had already called her with these results. I apologize and told patient it was not documented in her chart. Patient verbalize understanding this time.

## 2019-01-21 NOTE — Telephone Encounter (Signed)
Called and spoke with patient with Panorama results

## 2019-01-26 ENCOUNTER — Encounter: Payer: Self-pay | Admitting: Radiology

## 2019-02-11 ENCOUNTER — Encounter: Payer: BLUE CROSS/BLUE SHIELD | Admitting: Family Medicine

## 2019-02-11 ENCOUNTER — Ambulatory Visit (HOSPITAL_COMMUNITY)
Admission: RE | Admit: 2019-02-11 | Discharge: 2019-02-11 | Disposition: A | Discharge status: Home / Self Care | Payer: BLUE CROSS/BLUE SHIELD | Source: Ambulatory Visit | Attending: Obstetrics and Gynecology | Admitting: Obstetrics and Gynecology

## 2019-02-11 ENCOUNTER — Other Ambulatory Visit: Payer: Self-pay

## 2019-02-11 ENCOUNTER — Other Ambulatory Visit (HOSPITAL_COMMUNITY): Payer: Self-pay | Admitting: *Deleted

## 2019-02-11 DIAGNOSIS — Z362 Encounter for other antenatal screening follow-up: Secondary | ICD-10-CM

## 2019-02-11 DIAGNOSIS — Z363 Encounter for antenatal screening for malformations: Secondary | ICD-10-CM

## 2019-02-11 DIAGNOSIS — O0990 Supervision of high risk pregnancy, unspecified, unspecified trimester: Secondary | ICD-10-CM | POA: Insufficient documentation

## 2019-02-12 ENCOUNTER — Encounter: Payer: BLUE CROSS/BLUE SHIELD | Admitting: Obstetrics and Gynecology

## 2019-02-19 ENCOUNTER — Other Ambulatory Visit: Payer: Self-pay

## 2019-02-19 ENCOUNTER — Ambulatory Visit (INDEPENDENT_AMBULATORY_CARE_PROVIDER_SITE_OTHER): Payer: BLUE CROSS/BLUE SHIELD | Admitting: Obstetrics and Gynecology

## 2019-02-19 DIAGNOSIS — O0990 Supervision of high risk pregnancy, unspecified, unspecified trimester: Secondary | ICD-10-CM

## 2019-02-19 DIAGNOSIS — R030 Elevated blood-pressure reading, without diagnosis of hypertension: Secondary | ICD-10-CM

## 2019-02-19 DIAGNOSIS — R768 Other specified abnormal immunological findings in serum: Secondary | ICD-10-CM | POA: Insufficient documentation

## 2019-02-19 DIAGNOSIS — R7303 Prediabetes: Secondary | ICD-10-CM

## 2019-02-19 DIAGNOSIS — O99210 Obesity complicating pregnancy, unspecified trimester: Secondary | ICD-10-CM

## 2019-02-19 DIAGNOSIS — Z3A19 19 weeks gestation of pregnancy: Secondary | ICD-10-CM

## 2019-02-19 DIAGNOSIS — E669 Obesity, unspecified: Secondary | ICD-10-CM

## 2019-02-19 NOTE — Progress Notes (Addendum)
   TELEHEALTH VIRTUAL OBSTETRICS VISIT ENCOUNTER NOTE  I connected with Danielle Waters on 02/19/19 at 10:00 AM EDT by telephone at home and verified that I am speaking with the correct person using two identifiers.   I discussed the limitations, risks, security and privacy concerns of performing an evaluation and management service by telephone and the availability of in person appointments. I also discussed with the patient that there may be a patient responsible charge related to this service. The patient expressed understanding and agreed to proceed.  Subjective:  Danielle Waters is a 28 y.o. G2P0010 at [redacted]w[redacted]d being followed for ongoing prenatal care.  She is currently monitored for the following issues for this low-risk pregnancy and has Obesity (BMI 30-39.9); Transient hypertension; PCOS (polycystic ovarian syndrome); Pre-diabetes; Supervision of high risk pregnancy, antepartum; Obesity affecting pregnancy, antepartum; Herpes exposure; and HSV-2 seropositive on their problem list.  Patient reports no complaints. Reports fetal movement. Denies any contractions, bleeding or leaking of fluid.   The following portions of the patient's history were reviewed and updated as appropriate: allergies, current medications, past family history, past medical history, past social history, past surgical history and problem list.   Objective:  There were no vitals filed for this visit.  General:  Alert, oriented and cooperative.   Mental Status: Normal mood and affect perceived. Normal judgment and thought content.  Rest of physical exam deferred due to type of encounter  Assessment and Plan:  Pregnancy: G2P0010 at [redacted]w[redacted]d 1. Supervision of high risk pregnancy, antepartum Routine care. F/u completion anatomy u/s. 28wk labs nv  2. Pre-diabetes  3. HSV-2 seropositive D/w pt later in pregnancy re: ppx  4. Transient hypertension Normal baby scripts data. Recommend q10d checks  5. Obesity (BMI  30-39.9) D/w her re: weight goals. Pt doesn't have scale at home  6. Obesity affecting pregnancy, antepartum   Preterm labor symptoms and general obstetric precautions including but not limited to vaginal bleeding, contractions, leaking of fluid and fetal movement were reviewed in detail with the patient.  I discussed the assessment and treatment plan with the patient. The patient was provided an opportunity to ask questions and all were answered. The patient agreed with the plan and demonstrated an understanding of the instructions. The patient was advised to call back or seek an in-person office evaluation/go to MAU at [facility] for any urgent or concerning symptoms. Please refer to After Visit Summary for other counseling recommendations.   I provided 10 minutes of non-face-to-face time during this encounter. The visit was conducted via Webex-medicine  Return in about 2 months (around 04/21/2019) for rob in person and 2h GTT.  Future Appointments  Date Time Provider Department Center  03/11/2019  2:45 PM [facility] [place] 2 [facility]US     Bing, MD Center for [facility], [facility]

## 2019-02-19 NOTE — Progress Notes (Signed)
I connected with  Danielle Waters on 02/19/19 at 10:00 AM EDT by telephone and verified that I am speaking with the correct person using two identifiers.   I discussed the limitations, risks, security and privacy concerns of performing an evaluation and management service by telephone and the availability of in person appointments. I also discussed with the patient that there may be a patient responsible charge related to this service. The patient expressed understanding and agreed to proceed.  Scheryl Marten, RN 02/19/2019  10:05 AM

## 2019-02-24 ENCOUNTER — Telehealth: Payer: Self-pay | Admitting: *Deleted

## 2019-02-24 NOTE — Telephone Encounter (Signed)
Pt called stating she feels like she pulled a muscle in her back and didn't know if she needed to be seen. Pt denies any vaginal bleeding. Instructed pt to try taking tylenol and use a heat pad on the area and to rest. If it does not get better to call the office back. Pt verbalizes.

## 2019-03-11 ENCOUNTER — Other Ambulatory Visit (HOSPITAL_COMMUNITY): Payer: Self-pay | Admitting: *Deleted

## 2019-03-11 ENCOUNTER — Ambulatory Visit (HOSPITAL_COMMUNITY)
Admission: RE | Admit: 2019-03-11 | Discharge: 2019-03-11 | Disposition: A | Discharge status: Home / Self Care | Payer: BLUE CROSS/BLUE SHIELD | Source: Ambulatory Visit | Attending: Maternal & Fetal Medicine | Admitting: Maternal & Fetal Medicine

## 2019-03-11 ENCOUNTER — Other Ambulatory Visit: Payer: Self-pay

## 2019-03-11 DIAGNOSIS — Z362 Encounter for other antenatal screening follow-up: Secondary | ICD-10-CM | POA: Diagnosis present

## 2019-03-11 DIAGNOSIS — Z3A22 22 weeks gestation of pregnancy: Secondary | ICD-10-CM | POA: Diagnosis not present

## 2019-03-16 ENCOUNTER — Telehealth: Payer: Self-pay

## 2019-03-16 NOTE — Telephone Encounter (Signed)
Patient is requesting a note for her employer due to high risk pregnancy and would like to work form home. Note has been provided.

## 2019-04-07 ENCOUNTER — Observation Stay
Admission: EM | Admit: 2019-04-07 | Discharge: 2019-04-07 | Disposition: A | Discharge status: Home / Self Care | Payer: BC Managed Care – PPO | Attending: Obstetrics and Gynecology | Admitting: Obstetrics and Gynecology

## 2019-04-07 ENCOUNTER — Other Ambulatory Visit: Payer: Self-pay

## 2019-04-07 DIAGNOSIS — R030 Elevated blood-pressure reading, without diagnosis of hypertension: Secondary | ICD-10-CM | POA: Insufficient documentation

## 2019-04-07 DIAGNOSIS — R7303 Prediabetes: Secondary | ICD-10-CM | POA: Insufficient documentation

## 2019-04-07 DIAGNOSIS — Z3A26 26 weeks gestation of pregnancy: Secondary | ICD-10-CM | POA: Insufficient documentation

## 2019-04-07 DIAGNOSIS — R102 Pelvic and perineal pain: Secondary | ICD-10-CM | POA: Insufficient documentation

## 2019-04-07 DIAGNOSIS — O26892 Other specified pregnancy related conditions, second trimester: Secondary | ICD-10-CM | POA: Diagnosis not present

## 2019-04-07 DIAGNOSIS — Z7984 Long term (current) use of oral hypoglycemic drugs: Secondary | ICD-10-CM | POA: Insufficient documentation

## 2019-04-07 DIAGNOSIS — O99212 Obesity complicating pregnancy, second trimester: Secondary | ICD-10-CM | POA: Diagnosis not present

## 2019-04-07 DIAGNOSIS — O26899 Other specified pregnancy related conditions, unspecified trimester: Secondary | ICD-10-CM | POA: Diagnosis present

## 2019-04-07 LAB — URINALYSIS, COMPLETE (UACMP) WITH MICROSCOPIC
Bilirubin Urine: NEGATIVE
Glucose, UA: NEGATIVE mg/dL
Hgb urine dipstick: NEGATIVE
Ketones, ur: 5 mg/dL — AB
Leukocytes,Ua: NEGATIVE
Nitrite: NEGATIVE
Protein, ur: NEGATIVE mg/dL
Specific Gravity, Urine: 1.021 (ref 1.005–1.030)
pH: 6 (ref 5.0–8.0)

## 2019-04-07 MED ORDER — ACETAMINOPHEN 500 MG PO TABS
ORAL_TABLET | ORAL | Status: AC
  Administered 2019-04-07: 1000 mg via ORAL
  Filled 2019-04-07: qty 2

## 2019-04-07 MED ORDER — ACETAMINOPHEN 500 MG PO TABS
1000.0000 mg | ORAL_TABLET | Freq: Once | ORAL | Status: AC
  Administered 2019-04-07: 1000 mg via ORAL

## 2019-04-07 NOTE — Progress Notes (Signed)
Patient ID: Danielle Waters, female   DOB: 06-Mar-1991, 28 y.o.   MRN: [id number]  Danielle Waters is a 28 y.o. female. She is at [redacted]w[redacted]d gestation. No LMP recorded. Patient is pregnant. Estimated Date of Delivery: 07/10/19 EGA 26+4 weeks . Unassigned  Pt by seen by Dr Ilda Basset  Prenatal care site: [facility]  Chief complaint:central pelvic pressure with walking . Started today ./ Only with walking . No LOF , No vaginal bleeding . No prior MVA or similar pain .  Pregnancy complicated by :  1. Supervision of high risk pregnancy, antepartum Routine care. F/u completion anatomy u/s. 28wk labs nv  2. Pre-diabetes  3. HSV-2 seropositive D/w pt later in pregnancy re: ppx  4. Transient hypertension Normal baby scripts data. Recommend q10d checks  5. Obesity (BMI 30-39.9) D/w her re: weight goals. Pt doesn't have scale at home  6. Obesity affecting pregnancy, antepartum  S: Resting comfortably. no CTX, no VB.no LOF,  Active fetal movement.  Maternal Medical History:   Past Medical History:  Diagnosis Date  . Influenza A   . Medical history non-contributory     Past Surgical History:  Procedure Laterality Date  . APPENDECTOMY    . DILATION AND CURETTAGE OF UTERUS    . LAPAROSCOPIC APPENDECTOMY N/A 10/30/2018   Procedure: APPENDECTOMY LAPAROSCOPIC;  Surgeon: Olean Ree, MD;  Location: [facility] ORS;  Service: General;  Laterality: N/A;    No Known Allergies  Prior to Admission medications   Medication Sig Start Date End Date Taking? Authorizing Provider  Prenatal Vit-Fe Fumarate-FA (MULTIVITAMIN-PRENATAL) 27-0.8 MG TABS tablet Take 1 tablet by mouth daily at 12 noon.   Yes [provider]  Chlorphen-Phenyleph-ASA (ALKA-SELTZER PLUS COLD) 2-7.8-325 MG TBEF Take 2 tablets by mouth every 6 (six) hours as needed.    [provider]  METAMUCIL 0.52 g capsule  12/23/18   [provider]  metFORMIN (GLUCOPHAGE) 1000 MG tablet Take 1,000 mg by mouth daily. 09/16/18    [provider]  metroNIDAZOLE (FLAGYL) 250 MG tablet Take 1 tablet (250 mg total) by mouth 2 (two) times daily. Patient not taking: Reported on 02/19/2019 01/19/19   Caren Macadam, MD     Social History: She  reports that she has never smoked. She has never used smokeless tobacco. She reports previous alcohol use. She reports that she does not use drugs.  Family History: family history includes Diabetes in her maternal grandmother and mother; Hypertension in her maternal grandmother and mother.  no history of gyn cancers  Review of Systems: Review of Systems: A full review of systems was performed and negative except as noted in the HPI.   Eyes: no vision change  Ears: left ear pain  Oropharynx: no sore throat  Pulmonary . No shortness of breath , no hemoptysis Cardiovascular: no chest pain , no irregular heart beat  Gastrointestinal:no blood in stool . No diarrhea, no constipation Uro gynecologic: no dysuria , no pelvic pain Neurologic : no seizure , no migraines    Musculoskeletal: no muscular weakness, + pubic bone pain   O:  BP 128/71 (BP Location: Left Arm)   Pulse 87   Temp 98.8 F (37.1 C) (Oral)   Resp 18   Ht 5\' 3"  (1.6 m)   Wt 74.8 kg   BMI 29.23 kg/m  No results found for this or any previous visit (from the past 48 hour(s)).   Constitutional: NAD, AAOx3  HE/ENT: extraocular movements grossly intact, moist mucous membranes CV: RRR  PULM: nl respiratory effort, CTABL  Back : no CVAT     Abd: gravid, non-tender, non-distended, soft      Ext: Non-tender, Nonedmeatous   Psych: mood appropriate, speech normal Pelvic cx : closed / long and OOP ; no blood on examining glove   NST: reactive 10x10  Baseline: 130 Variability: moderate Accelerations present x >2 Decelerations absent Time 20mins    Assessment: 28 y.o. 564w4d with new onset pelvic pain . Most c/w symphysis pain  Principle diagnosis:   Plan: UAC+S  Reassured pt she is not in labor  and fetus has a reassuring fetal monitoring  1000 mg Tylenol   Labor: not present.   Fetal Wellbeing: Reassuring Cat 1 tracing.  Reactive NST   D/c home stable, precautions reviewed, follow-up as scheduled.    recommend that she calls her managing OB in future for issues that arise after hours .  ----- Beverly Gust Jamarii Banks MD Attending Obstetrician and Gynecologist [facility], Department of OB/GYN [facility]

## 2019-04-07 NOTE — Discharge Summary (Signed)
Patient ID: Danielle ReedyCourtney Detwiler, female   DOB: 04/30/1991, 28 y.o.   MRN: [id number]  Subjective   Danielle Waters is a 28 y.o. female. She is at 6079w4d gestation. No LMP recorded. Patient is pregnant. Estimated Date of Delivery: 07/10/19 EGA 26+4 weeks . Unassigned  Pt by seen by Dr Vergie LivingPickens  Prenatal care site: [facility]  Chief complaint:central pelvic pressure with walking . Started today ./ Only with walking . No LOF , No vaginal bleeding . No prior MVA or similar pain .  Pregnancy complicated by :  1. Supervision of high risk pregnancy, antepartum Routine care. F/u completion anatomy u/s. 28wk labs nv  2. Pre-diabetes  3. HSV-2 seropositive D/w pt later in pregnancy re: ppx  4. Transient hypertension Normal baby scripts data. Recommend q10d checks  5. Obesity (BMI 30-39.9) D/w her re: weight goals. Pt doesn't have scale at home  6. Obesity affecting pregnancy, antepartum  S: Resting comfortably. noCTX, no VB.no LOF, Active fetal movement.  Maternal Medical History:       Past Medical History:  Diagnosis Date  . Influenza A   . Medical history non-contributory          Past Surgical History:  Procedure Laterality Date  . APPENDECTOMY    . DILATION AND CURETTAGE OF UTERUS    . LAPAROSCOPIC APPENDECTOMY N/A 10/30/2018   Procedure: APPENDECTOMY LAPAROSCOPIC;  Surgeon: Henrene DodgePiscoya, Jose, MD;  Location: [facility] ORS;  Service: General;  Laterality: N/A;    No Known Allergies         Prior to Admission medications   Medication Sig Start Date End Date Taking? Authorizing Provider  Prenatal Vit-Fe Fumarate-FA (MULTIVITAMIN-PRENATAL) 27-0.8 MG TABS tablet Take 1 tablet by mouth daily at 12 noon.   Yes [provider]  Chlorphen-Phenyleph-ASA (ALKA-SELTZER PLUS COLD) 2-7.8-325 MG TBEF Take 2 tablets by mouth every 6 (six) hours as needed.    [provider]  METAMUCIL 0.52 g capsule  12/23/18   [provider]  metFORMIN  (GLUCOPHAGE) 1000 MG tablet Take 1,000 mg by mouth daily. 09/16/18   [provider]  metroNIDAZOLE (FLAGYL) 250 MG tablet Take 1 tablet (250 mg total) by mouth 2 (two) times daily. Patient not taking: Reported on 02/19/2019 01/19/19   Federico FlakeNewton, Kimberly Niles, MD     Social History: She  reports that she has never smoked. She has never used smokeless tobacco. She reports previous alcohol use. She reports that she does not use drugs.  Family History: family history includes Diabetes in her maternal grandmother and mother; Hypertension in her maternal grandmother and mother.  no history of gyn cancers  Review of Systems: Review of Systems: A full review of systems was performed and negativeexcept as noted in the HPI.  Eyes: no vision change  Ears: left ear pain  Oropharynx: no sore throat  Pulmonary . No shortness of breath , no hemoptysis Cardiovascular: no chest pain , no irregular heart beat  Gastrointestinal:no blood in stool . No diarrhea, no constipation Uro gynecologic: no dysuria , no pelvic pain Neurologic : no seizure , no migraines  Musculoskeletal: no muscular weakness, + pubic bone pain   O:  Objective   BP 128/71 (BP Location: Left Arm)   Pulse 87   Temp 98.8 F (37.1 C) (Oral)   Resp 18   Ht 5\' 3"  (1.6 m)   Wt 74.8 kg   BMI 29.23 kg/m  No results found for this or any previous visit (from the past 48 hour(s)).  Constitutional: NAD, AAOx3  HE/ENT: extraocular movements grossly intact, moist mucous membranes CV: RRR PULM: nl respiratory effort, CTABL     Back : no CVAT                                   Abd: gravid, non-tender, non-distended, soft                                                  Ext: Non-tender, Nonedmeatous                     Psych: mood appropriate, speech normal Pelvic cx : closed / long and OOP ; no blood on examining glove   NST: reactive 10x10  Baseline: 130 Variability: moderate Accelerations present x >2  Decelerations absent Time 74mins    Assessment: 28 y.o. [redacted]w[redacted]d with new onset pelvic pain . Most c/w symphysis pain  Principle diagnosis:   Plan: UAC+S  Reassured pt she is not in labor and fetus has a reassuring fetal monitoring  1000 mg Tylenol   Labor: not present.   Fetal Wellbeing: Reassuring Cat 1 tracing.  Reactive NST   D/c home stable, precautions reviewed, follow-up as scheduled.    recommend that she calls her managing OB in future for issues that arise after hours .  ----- Huel Cote MD Attending Obstetrician and Gynecologist [facility], Department of [facility]

## 2019-04-07 NOTE — OB Triage Note (Signed)
Recvd pt from ED. Pt c/o pelvic pressure that started about an hour ago. Pressure is worse when she is standing up and moving around. Pt denies contractions, LOF or vaginal bleeding. Pt reports positive fetal movement. Wanted to come in to make sure everything was ok.

## 2019-04-08 ENCOUNTER — Ambulatory Visit (HOSPITAL_COMMUNITY): Payer: BLUE CROSS/BLUE SHIELD

## 2019-04-08 ENCOUNTER — Encounter (HOSPITAL_COMMUNITY): Payer: Self-pay

## 2019-04-08 LAB — URINE CULTURE

## 2019-04-09 ENCOUNTER — Other Ambulatory Visit: Payer: Self-pay

## 2019-04-09 ENCOUNTER — Ambulatory Visit (HOSPITAL_COMMUNITY)
Admission: RE | Admit: 2019-04-09 | Discharge: 2019-04-09 | Disposition: A | Discharge status: Home / Self Care | Payer: BC Managed Care – PPO | Source: Ambulatory Visit | Attending: Maternal & Fetal Medicine | Admitting: Maternal & Fetal Medicine

## 2019-04-09 DIAGNOSIS — Z362 Encounter for other antenatal screening follow-up: Secondary | ICD-10-CM

## 2019-04-09 DIAGNOSIS — Z3A26 26 weeks gestation of pregnancy: Secondary | ICD-10-CM

## 2019-04-14 ENCOUNTER — Ambulatory Visit (INDEPENDENT_AMBULATORY_CARE_PROVIDER_SITE_OTHER): Payer: BC Managed Care – PPO | Admitting: Family Medicine

## 2019-04-14 ENCOUNTER — Other Ambulatory Visit: Payer: Self-pay

## 2019-04-14 VITALS — BP 128/84 | HR 72 | Wt 199.8 lb

## 2019-04-14 DIAGNOSIS — O0990 Supervision of high risk pregnancy, unspecified, unspecified trimester: Secondary | ICD-10-CM

## 2019-04-14 DIAGNOSIS — O0992 Supervision of high risk pregnancy, unspecified, second trimester: Secondary | ICD-10-CM

## 2019-04-14 DIAGNOSIS — Z23 Encounter for immunization: Secondary | ICD-10-CM | POA: Diagnosis not present

## 2019-04-14 NOTE — Progress Notes (Signed)
   PRENATAL VISIT NOTE  Subjective:  Danielle Waters is a 28 y.o. G2P0010 at [redacted]w[redacted]d being seen today for ongoing prenatal care.  She is currently monitored for the following issues for this low-risk pregnancy and has Obesity (BMI 30-39.9); Transient hypertension; PCOS (polycystic ovarian syndrome); Pre-diabetes; Supervision of high risk pregnancy, antepartum; Obesity affecting pregnancy, antepartum; Herpes exposure; HSV-2 seropositive; and Pelvic pain affecting pregnancy on their problem list.  Patient reports no complaints.  Contractions: Not present.  .  Movement: Present. Denies leaking of fluid.   The following portions of the patient's history were reviewed and updated as appropriate: allergies, current medications, past family history, past medical history, past social history, past surgical history and problem list.   Objective:   Vitals:   04/14/19 0824  BP: 128/84  Pulse: 72  Weight: 199 lb 12.8 oz (90.6 kg)    Fetal Status: Fetal Heart Rate (bpm): 127 Fundal Height: 27 cm Movement: Present     General:  Alert, oriented and cooperative. Patient is in no acute distress.  Skin: Skin is warm and dry. No rash noted.   Cardiovascular: Normal heart rate noted  Respiratory: Normal respiratory effort, no problems with respiration noted  Abdomen: Soft, gravid, appropriate for gestational age.  Pain/Pressure: Absent     Pelvic: Cervical exam deferred        Extremities: Normal range of motion.     Mental Status: Normal mood and affect. Normal behavior. Normal judgment and thought content.   Assessment and Plan:  Pregnancy: G2P0010 at [redacted]w[redacted]d 1. Supervision of high risk pregnancy, antepartum 28 wks and TDaP today - RPR - Glucose Tolerance, 2 Hours w/1 Hour - CBC - HIV Antibody (routine testing w rflx)  Preterm labor symptoms and general obstetric precautions including but not limited to vaginal bleeding, contractions, leaking of fluid and fetal movement were reviewed in detail with  the patient. Please refer to After Visit Summary for other counseling recommendations.   Return in 4 weeks (on 05/12/2019) for virtual.  Future Appointments  Date Time Provider Department Center  05/12/2019 10:00 AM Caren Macadam, MD [facility]    Donnamae Jude, MD

## 2019-04-14 NOTE — Patient Instructions (Signed)

## 2019-04-14 NOTE — Progress Notes (Signed)
List of pediatrician  Tdap given today

## 2019-04-16 ENCOUNTER — Other Ambulatory Visit: Payer: Self-pay | Admitting: Family Medicine

## 2019-04-16 DIAGNOSIS — O99013 Anemia complicating pregnancy, third trimester: Secondary | ICD-10-CM

## 2019-04-16 LAB — CBC
Hematocrit: 30.5 % — ABNORMAL LOW (ref 34.0–46.6)
Hemoglobin: 10.7 g/dL — ABNORMAL LOW (ref 11.1–15.9)
MCH: 34.4 pg — ABNORMAL HIGH (ref 26.6–33.0)
MCHC: 35.1 g/dL (ref 31.5–35.7)
MCV: 98 fL — ABNORMAL HIGH (ref 79–97)
Platelets: 262 10*3/uL (ref 150–450)
RBC: 3.11 x10E6/uL — ABNORMAL LOW (ref 3.77–5.28)
RDW: 12 % (ref 11.7–15.4)
WBC: 7.4 10*3/uL (ref 3.4–10.8)

## 2019-04-16 LAB — HIV ANTIBODY (ROUTINE TESTING W REFLEX): HIV Screen 4th Generation wRfx: NONREACTIVE

## 2019-04-16 LAB — GLUCOSE TOLERANCE, 2 HOURS W/ 1HR
Glucose, 1 hour: 157 mg/dL (ref 65–179)
Glucose, 2 hour: 136 mg/dL (ref 65–152)
Glucose, Fasting: 73 mg/dL (ref 65–91)

## 2019-04-16 LAB — RPR: RPR Ser Ql: NONREACTIVE

## 2019-04-16 MED ORDER — FERROUS SULFATE 325 (65 FE) MG PO TABS: 325.0000 mg | ORAL_TABLET | Freq: Every day | ORAL | 1 refills | Status: DC

## 2019-04-30 LAB — CERVICOVAGINAL ANCILLARY ONLY: Herpes: NEGATIVE

## 2019-05-12 ENCOUNTER — Other Ambulatory Visit: Payer: Self-pay

## 2019-05-12 ENCOUNTER — Telehealth (INDEPENDENT_AMBULATORY_CARE_PROVIDER_SITE_OTHER): Payer: BC Managed Care – PPO | Admitting: Family Medicine

## 2019-05-12 ENCOUNTER — Encounter: Payer: Self-pay | Admitting: Family Medicine

## 2019-05-12 DIAGNOSIS — R768 Other specified abnormal immunological findings in serum: Secondary | ICD-10-CM

## 2019-05-12 DIAGNOSIS — O99213 Obesity complicating pregnancy, third trimester: Secondary | ICD-10-CM

## 2019-05-12 DIAGNOSIS — O0993 Supervision of high risk pregnancy, unspecified, third trimester: Secondary | ICD-10-CM

## 2019-05-12 DIAGNOSIS — O0990 Supervision of high risk pregnancy, unspecified, unspecified trimester: Secondary | ICD-10-CM

## 2019-05-12 DIAGNOSIS — O99210 Obesity complicating pregnancy, unspecified trimester: Secondary | ICD-10-CM

## 2019-05-12 DIAGNOSIS — Z3A31 31 weeks gestation of pregnancy: Secondary | ICD-10-CM

## 2019-05-12 NOTE — Progress Notes (Signed)
I connected with  Suzy [place]cotton on 05/12/19 at 10:00 AM EDT by telephone and verified that I am speaking with the correct person using two identifiers.   I discussed the limitations, risks, security and privacy concerns of performing an evaluation and management service by telephone and the availability of in person appointments. I also discussed with the patient that there may be a patient responsible charge related to this service. The patient expressed understanding and agreed to proceed.  Brenlynn Fake Jeanella Anton, CMA 05/12/2019  9:59 AM

## 2019-05-12 NOTE — Progress Notes (Signed)
I connected with@ on 05/12/19 at 10:00 AM EDT by: MyChart video and verified that I am speaking with the correct person using two identifiers.  Patient is located at [facility] and provider is located at [facility].     The purpose of this virtual visit is to provide medical care while limiting exposure to the novel coronavirus. I discussed the limitations, risks, security and privacy concerns of performing an evaluation and management service by video and the availability of in person appointments. I also discussed with the patient that there may be a patient responsible charge related to this service. By engaging in this virtual visit, you consent to the provision of healthcare.  Additionally, you authorize for your insurance to be billed for the services provided during this visit.  The patient expressed understanding and agreed to proceed.  The following staff members participated in the virtual visit:  Demetrice Phillip Heal    PRENATAL VISIT NOTE  Subjective:  Danielle Waters is a 28 y.o. G2P0010 at [redacted]w[redacted]d  for phone visit for ongoing prenatal care.  She is currently monitored for the following issues for this high-risk pregnancy and has Obesity (BMI 30-39.9); Transient hypertension; PCOS (polycystic ovarian syndrome); Pre-diabetes; Supervision of high risk pregnancy, antepartum; Obesity affecting pregnancy, antepartum; Herpes exposure; HSV-2 seropositive; and Pelvic pain affecting pregnancy on their problem list.  Patient reports no complaints.  Contractions: Not present.  .  Movement: Present. Denies leaking of fluid.   The following portions of the patient's history were reviewed and updated as appropriate: allergies, current medications, past family history, past medical history, past social history, past surgical history and problem list.   Objective:   Vitals:   05/12/19 1000  BP: 114/65  Pulse: 72   Self-Obtained  Fetal Status:     Movement: Present     Assessment and Plan:  Pregnancy: G2P0010  at [redacted]w[redacted]d 1. Obesity affecting pregnancy, antepartum TWG=44 lb 12.8 oz (20.3 kg)   2. Supervision of high risk pregnancy, antepartum Up to date Asked about paternity testing- recommend she research this for after baby's birth Insurance provides breast pump- has received  Maternity belt is helping  3. HSV- seropositive and partner with known herpes exposure - Declines PPX at 36 weeks - She has never had a genital outbreak-- has HSV IGG and partner with oral lesions and they engage in oral sex.  - Reviewed risks of non treatment and possibility of outbreak at term requiring a CS. She is comfortable with this risk of CS which is relatively low but present. She understanding the alternative is takign anti-viral treatment at 36 wks to prevent outbreak and declines this intervention. Engaged in joint decision making.   Preterm labor symptoms and general obstetric precautions including but not limited to vaginal bleeding, contractions, leaking of fluid and fetal movement were reviewed in detail with the patient.  Return in about 2 weeks (around 05/26/2019) for Routine prenatal care, Telehealth/Virtual health OB Visit.  Future Appointments  Date Time Provider [place]  05/26/2019 10:00 AM Sloan Leiter, MD [facility]     Time spent on virtual visit: 15 minutes  Caren Macadam, MD

## 2019-05-12 NOTE — Patient Instructions (Signed)
Places to have your son circumcised:                                                                      [facility]     [phone]   $480 while you are in hospital         [facility]              [phone]   $269 by 4 wks                      [facility]                     [phone]   $269 by 7 days [facility]                    [phone]   $269 by 4 wks [facility]             [phone]   $225 by 2 wks    These prices sometimes change but are roughly what you can expect to pay. Please call and confirm pricing.   Circumcision is considered an elective/non-medically necessary procedure. There are many reasons parents decide to have their sons circumsized. During the first year of life circumcised males have a reduced risk of urinary tract infections but after this year the rates between circumcised males and uncircumcised males are the same.  It is safe to have your son circumcised outside of the hospital and the places above perform them regularly.   Deciding about Circumcision in Baby Boys  (Up-to-date The Basics)  What is circumcision?   Circumcision is a surgery that removes the skin that covers the tip of the penis, called the "foreskin" Circumcision is usually done when a boy is between 1 and 10 days old. In the [place], circumcision is common. In some other countries, fewer boys are circumcised. Circumcision is a common tradition in some religions.  Should I have my baby boy circumcised?   There is no easy answer. Circumcision has some benefits. But it also has risks. After talking with your doctor, you will have to decide for yourself what is right for your family.  What are the benefits of circumcision?   Circumcised boys seem to have slightly lower rates of: ?Urinary tract infections ?Swelling of the opening at the tip of the penis Circumcised men seem to have slightly lower rates of: ?Urinary tract infections ?Swelling of the opening at the tip of the penis ?Penis cancer ?HIV  and other infections that you catch during sex ?Cervical cancer in the women they have sex with Even so, in the [place], the risks of these problems are small - even in boys and men who have not been circumcised. Plus, boys and men who are not circumcised can reduce these extra risks by: ?Cleaning their penis well ?Using condoms during sex  What are the risks of circumcision?  Risks include: ?Bleeding or infection from the surgery ?Damage to or amputation of the penis ?A chance that the doctor will cut off too much or not enough of the foreskin ?A chance that sex won't feel as good later in life Only about 1 out of every 200   circumcisions leads to problems. There is also a chance that your health insurance won't pay for circumcision.  How is circumcision done in baby boys?  First, the baby gets medicine for pain relief. This might be a cream on the skin or a shot into the base of the penis. Next, the doctor cleans the baby's penis well. Then he or she uses special tools to cut off the foreskin. Finally, the doctor wraps a bandage (called gauze) around the baby's penis. If you have your baby circumcised, his doctor or nurse will give you instructions on how to care for him after the surgery. It is important that you follow those instructions carefully.  

## 2019-05-26 ENCOUNTER — Telehealth: Payer: BC Managed Care – PPO | Admitting: Obstetrics and Gynecology

## 2019-05-28 ENCOUNTER — Other Ambulatory Visit: Payer: Self-pay

## 2019-05-28 ENCOUNTER — Telehealth (INDEPENDENT_AMBULATORY_CARE_PROVIDER_SITE_OTHER): Payer: BC Managed Care – PPO | Admitting: Obstetrics and Gynecology

## 2019-05-28 ENCOUNTER — Encounter: Payer: Self-pay | Admitting: Obstetrics and Gynecology

## 2019-05-28 VITALS — BP 128/82

## 2019-05-28 DIAGNOSIS — O0993 Supervision of high risk pregnancy, unspecified, third trimester: Secondary | ICD-10-CM

## 2019-05-28 DIAGNOSIS — O99210 Obesity complicating pregnancy, unspecified trimester: Secondary | ICD-10-CM

## 2019-05-28 DIAGNOSIS — Z3A33 33 weeks gestation of pregnancy: Secondary | ICD-10-CM

## 2019-05-28 DIAGNOSIS — O0990 Supervision of high risk pregnancy, unspecified, unspecified trimester: Secondary | ICD-10-CM

## 2019-05-28 DIAGNOSIS — R768 Other specified abnormal immunological findings in serum: Secondary | ICD-10-CM

## 2019-05-28 DIAGNOSIS — Z20828 Contact with and (suspected) exposure to other viral communicable diseases: Secondary | ICD-10-CM

## 2019-05-28 DIAGNOSIS — O99213 Obesity complicating pregnancy, third trimester: Secondary | ICD-10-CM

## 2019-05-28 MED ORDER — VALACYCLOVIR HCL 500 MG PO TABS: 500.0000 mg | ORAL_TABLET | Freq: Two times a day (BID) | ORAL | 6 refills | Status: DC

## 2019-05-28 NOTE — Progress Notes (Signed)
   TELEHEALTH OBSTETRICS PRENATAL VIRTUAL VIDEO VISIT ENCOUNTER NOTE  Provider location: [facility] [place] [facility]   I connected with [employer] on 05/28/19 at  1:20 PM EDT by MyChart Video Encounter at home and verified that I am speaking with the correct person using two identifiers.   I discussed the limitations, risks, security and privacy concerns of performing an evaluation and management service virtually and the availability of in person appointments. I also discussed with the patient that there may be a patient responsible charge related to this service. The patient expressed understanding and agreed to proceed. Subjective:  Danielle Waters is a 28 y.o. G2P0010 at [redacted]w[redacted]d being seen today for ongoing prenatal care.  She is currently monitored for the following issues for this low-risk pregnancy and has Obesity (BMI 30-39.9); Transient hypertension; PCOS (polycystic ovarian syndrome); Pre-diabetes; Supervision of high risk pregnancy, antepartum; Obesity affecting pregnancy, antepartum; Herpes exposure; HSV-2 seropositive; and Pelvic pain affecting pregnancy on their problem list.  Patient reports no complaints.  Contractions: Not present. Vag. Bleeding: None.  Movement: Present. Denies any leaking of fluid.   The following portions of the patient's history were reviewed and updated as appropriate: allergies, current medications, past family history, past medical history, past social history, past surgical history and problem list.   Objective:   Vitals:   05/28/19 1331  BP: 128/82    Fetal Status:     Movement: Present     General:  Alert, oriented and cooperative. Patient is in no acute distress.  Respiratory: Normal respiratory effort, no problems with respiration noted  Mental Status: Normal mood and affect. Normal behavior. Normal judgment and thought content.  Rest of physical exam deferred due to type of encounter  Imaging: No results found.   Assessment and Plan:  Pregnancy: G2P0010 at [redacted]w[redacted]d  1. Supervision of high risk pregnancy, antepartum  2. Obesity affecting pregnancy, antepartum Last growth [place] with EFW 93%tile, repeat growth [place] ordered  3. Herpes exposure  4. HSV-2 seropositive Reviewed risks/benefits of valtrex, need to c-section if outbreak at the time of delivery, she opts to take ppx, sent to pharmacy  Preterm labor symptoms and general obstetric precautions including but not limited to vaginal bleeding, contractions, leaking of fluid and fetal movement were reviewed in detail with the patient. I discussed the assessment and treatment plan with the patient. The patient was provided an opportunity to ask questions and all were answered. The patient agreed with the plan and demonstrated an understanding of the instructions. The patient was advised to call back or seek an in-person office evaluation/go to [facility] at [facility] for any urgent or concerning symptoms. Please refer to After Visit Summary for other counseling recommendations.   I provided 15 minutes of face-to-face time during this encounter.  Return in about 2 weeks (around 06/11/2019) for OB visit (MD), in person.  No future appointments.  Sloan Leiter, MD [facility] [place] [place]

## 2019-06-01 ENCOUNTER — Inpatient Hospital Stay (HOSPITAL_COMMUNITY)
Admission: EM | Admit: 2019-06-01 | Discharge: 2019-06-02 | Disposition: A | Discharge status: Home / Self Care | Payer: BC Managed Care – PPO | Attending: Obstetrics and Gynecology | Admitting: Obstetrics and Gynecology

## 2019-06-01 DIAGNOSIS — R030 Elevated blood-pressure reading, without diagnosis of hypertension: Secondary | ICD-10-CM

## 2019-06-01 DIAGNOSIS — Z3A34 34 weeks gestation of pregnancy: Secondary | ICD-10-CM

## 2019-06-01 DIAGNOSIS — O26893 Other specified pregnancy related conditions, third trimester: Secondary | ICD-10-CM | POA: Insufficient documentation

## 2019-06-01 DIAGNOSIS — Z0371 Encounter for suspected problem with amniotic cavity and membrane ruled out: Secondary | ICD-10-CM

## 2019-06-01 DIAGNOSIS — Z8249 Family history of ischemic heart disease and other diseases of the circulatory system: Secondary | ICD-10-CM | POA: Insufficient documentation

## 2019-06-01 DIAGNOSIS — Z833 Family history of diabetes mellitus: Secondary | ICD-10-CM | POA: Insufficient documentation

## 2019-06-02 ENCOUNTER — Encounter (HOSPITAL_COMMUNITY): Payer: Self-pay

## 2019-06-02 ENCOUNTER — Other Ambulatory Visit: Payer: Self-pay

## 2019-06-02 ENCOUNTER — Telehealth: Payer: Self-pay | Admitting: Radiology

## 2019-06-02 DIAGNOSIS — N898 Other specified noninflammatory disorders of vagina: Secondary | ICD-10-CM | POA: Diagnosis present

## 2019-06-02 DIAGNOSIS — Z833 Family history of diabetes mellitus: Secondary | ICD-10-CM | POA: Diagnosis not present

## 2019-06-02 DIAGNOSIS — Z3A34 34 weeks gestation of pregnancy: Secondary | ICD-10-CM

## 2019-06-02 DIAGNOSIS — Z0371 Encounter for suspected problem with amniotic cavity and membrane ruled out: Secondary | ICD-10-CM

## 2019-06-02 DIAGNOSIS — Z8249 Family history of ischemic heart disease and other diseases of the circulatory system: Secondary | ICD-10-CM | POA: Diagnosis not present

## 2019-06-02 DIAGNOSIS — O26893 Other specified pregnancy related conditions, third trimester: Secondary | ICD-10-CM

## 2019-06-02 DIAGNOSIS — R030 Elevated blood-pressure reading, without diagnosis of hypertension: Secondary | ICD-10-CM | POA: Diagnosis not present

## 2019-06-02 LAB — COMPREHENSIVE METABOLIC PANEL
ALT: 13 U/L (ref 0–44)
AST: 14 U/L — ABNORMAL LOW (ref 15–41)
Albumin: 2.7 g/dL — ABNORMAL LOW (ref 3.5–5.0)
Alkaline Phosphatase: 62 U/L (ref 38–126)
Anion gap: 10 (ref 5–15)
BUN: 5 mg/dL — ABNORMAL LOW (ref 6–20)
CO2: 21 mmol/L — ABNORMAL LOW (ref 22–32)
Calcium: 8.6 mg/dL — ABNORMAL LOW (ref 8.9–10.3)
Chloride: 105 mmol/L (ref 98–111)
Creatinine, Ser: 0.59 mg/dL (ref 0.44–1.00)
GFR calc Af Amer: >60 mL/min (ref >60)
GFR calc non Af Amer: >60 mL/min (ref >60)
Glucose, Bld: 87 mg/dL (ref 70–99)
Potassium: 3.1 mmol/L — ABNORMAL LOW (ref 3.5–5.1)
Sodium: 136 mmol/L (ref 135–145)
Total Bilirubin: 0.4 mg/dL (ref 0.3–1.2)
Total Protein: 6 g/dL — ABNORMAL LOW (ref 6.5–8.1)

## 2019-06-02 LAB — CBC
HCT: 31 % — ABNORMAL LOW (ref 36.0–46.0)
Hemoglobin: 10.5 g/dL — ABNORMAL LOW (ref 12.0–15.0)
MCH: 33.9 pg (ref 26.0–34.0)
MCHC: 33.9 g/dL (ref 30.0–36.0)
MCV: 100 fL (ref 80.0–100.0)
Platelets: 240 10*3/uL (ref 150–400)
RBC: 3.1 MIL/uL — ABNORMAL LOW (ref 3.87–5.11)
RDW: 13 % (ref 11.5–15.5)
WBC: 7 10*3/uL (ref 4.0–10.5)
nRBC: 0 % (ref 0.0–0.2)

## 2019-06-02 LAB — PROTEIN / CREATININE RATIO, URINE
Creatinine, Urine: 37.38 mg/dL
Total Protein, Urine: <6 mg/dL

## 2019-06-02 LAB — POCT FERN TEST: POCT Fern Test: NEGATIVE

## 2019-06-02 NOTE — Discharge Instructions (Signed)
Fetal Movement Counts Patient Name: ________________________________________________ Patient Due Date: ____________________ What is a fetal movement count?  A fetal movement count is the number of times that you feel your baby move during a certain amount of time. This may also be called a fetal kick count. A fetal movement count is recommended for every pregnant woman. You may be asked to start counting fetal movements as early as week 28 of your pregnancy. Pay attention to when your baby is most active. You may notice your baby's sleep and wake cycles. You may also notice things that make your baby move more. You should do a fetal movement count:  When your baby is normally most active.  At the same time each day. A good time to count movements is while you are resting, after having something to eat and drink. How do I count fetal movements? 1. Find a quiet, comfortable area. Sit, or lie down on your side. 2. Write down the date, the start time and stop time, and the number of movements that you felt between those two times. Take this information with you to your health care visits. 3. For 2 hours, count kicks, flutters, swishes, rolls, and jabs. You should feel at least 10 movements during 2 hours. 4. You may stop counting after you have felt 10 movements. 5. If you do not feel 10 movements in 2 hours, have something to eat and drink. Then, keep resting and counting for 1 hour. If you feel at least 4 movements during that hour, you may stop counting. Contact a health care provider if:  You feel fewer than 4 movements in 2 hours.  Your baby is not moving like he or she usually does. Date: ____________ Start time: ____________ Stop time: ____________ Movements: ____________ Date: ____________ Start time: ____________ Stop time: ____________ Movements: ____________ Date: ____________ Start time: ____________ Stop time: ____________ Movements: ____________ Date: ____________ Start time:  ____________ Stop time: ____________ Movements: ____________ Date: ____________ Start time: ____________ Stop time: ____________ Movements: ____________ Date: ____________ Start time: ____________ Stop time: ____________ Movements: ____________ Date: ____________ Start time: ____________ Stop time: ____________ Movements: ____________ Date: ____________ Start time: ____________ Stop time: ____________ Movements: ____________ Date: ____________ Start time: ____________ Stop time: ____________ Movements: ____________ This information is not intended to replace advice given to you by your health care provider. Make sure you discuss any questions you have with your health care provider. Document Released: 10/31/2006 Document Revised: 10/21/2018 Document Reviewed: 11/10/2015 Elsevier Patient Education  2020 [place]. Hypertension During Pregnancy High blood pressure (hypertension) is when the force of blood pumping through the arteries is too strong. Arteries are blood vessels that carry blood from the heart throughout the body. Hypertension during pregnancy can be mild or severe. Severe hypertension during pregnancy (preeclampsia) is a medical emergency that requires prompt evaluation and treatment. Different types of hypertension can happen during pregnancy. These include:  Chronic hypertension. This happens when you had high blood pressure before you became pregnant, and it continues during the pregnancy. Hypertension that develops before you are [redacted] weeks pregnant and continues during the pregnancy is also called chronic hypertension. If you have chronic hypertension, it will not go away after you have your baby. You will need follow-up visits with your health care provider after you have your baby. Your doctor may want you to keep taking medicine for your blood pressure.  Gestational hypertension. This is hypertension that develops after the 20th week of pregnancy. Gestational hypertension usually  goes away after  you have your baby, but your health care provider will need to monitor your blood pressure to make sure that it is getting better.  Preeclampsia. This is severe hypertension during pregnancy. This can cause serious complications for you and your baby and can also cause complications for you after the delivery of your baby.  Postpartum preeclampsia. You may develop severe hypertension after giving birth. This usually occurs within 48 hours after childbirth but may occur up to 6 weeks after giving birth. This is rare. How does this affect me? Women who have hypertension during pregnancy have a greater chance of developing hypertension later in life or during future pregnancies. In some cases, hypertension during pregnancy can cause serious complications, such as:  Stroke.  Heart attack.  Injury to other organs, such as kidneys, lungs, or liver.  Preeclampsia.  Convulsions or seizures.  Placental abruption. How does this affect my baby? Hypertension during pregnancy can affect your baby. Your baby may:  Be born early (prematurely).  Not weigh as much as he or she should at birth (low birth weight).  Not tolerate labor well, leading to an unplanned cesarean delivery. What are the risks? There are certain factors that make it more likely for you to develop hypertension during pregnancy. These include:  Having hypertension during a previous pregnancy.  Being overweight.  Being age 60 or older.  Being pregnant for the first time.  Being pregnant with more than one baby.  Becoming pregnant using fertilization methods, such as IVF (in vitro fertilization).  Having other medical problems, such as diabetes, kidney disease, or lupus.  Having a family history of hypertension. What can I do to lower my risk? The exact cause of hypertension during pregnancy is not known. You may be able to lower your risk by:  Maintaining a healthy weight.  Eating a healthy and  balanced diet.  Following your health care provider's instructions about treating any long-term conditions that you had before becoming pregnant. It is very important to keep all of your prenatal care appointments. Your health care provider will check your blood pressure and make sure that your pregnancy is progressing as expected. If a problem is found, early treatment can prevent complications. How is this treated? Treatment for hypertension during pregnancy varies depending on the type of hypertension you have and how serious it is.  If you were taking medicine for high blood pressure before you became pregnant, talk with your health care provider. You may need to change medicine during pregnancy because some medicines, like ACE inhibitors, may not be considered safe for your baby.  If you have gestational hypertension, your health care provider may order medicine to treat this during pregnancy.  If you are at risk for preeclampsia, your health care provider may recommend that you take a low-dose aspirin during your pregnancy.  If you have severe hypertension, you may need to be hospitalized so you and your baby can be monitored closely. You may also need to be given medicine to lower your blood pressure. This medicine may be given by mouth or through an IV.  In some cases, if your condition gets worse, you may need to deliver your baby early. Follow these instructions at home: Eating and drinking   Drink enough fluid to keep your urine pale yellow.  Avoid caffeine. Lifestyle  Do not use any products that contain nicotine or tobacco, such as cigarettes, e-cigarettes, and chewing tobacco. If you need help quitting, ask your health care provider.  Do  not use alcohol or drugs.  Avoid stress as much as possible.  Rest and get plenty of sleep.  Regular exercise can help to reduce your blood pressure. Ask your health care provider what kinds of exercise are best for you. General  instructions  Take over-the-counter and prescription medicines only as told by your health care provider.  Keep all prenatal and follow-up visits as told by your health care provider. This is important. Contact a health care provider if:  You have symptoms that your health care provider told you may require more treatment or monitoring, such as: ? Headaches. ? Nausea or vomiting. ? Abdominal pain. ? Dizziness. ? Light-headedness. Get help right away if:  You have: ? Severe abdominal pain that does not get better with treatment. ? A severe headache that does not get better. ? Vomiting that does not get better. ? Sudden, rapid weight gain. ? Sudden swelling in your hands, ankles, or face. ? Vaginal bleeding. ? Blood in your urine. ? Blurred or double vision. ? Shortness of breath or chest pain. ? Weakness on one side of your body. ? Difficulty speaking.  Your baby is not moving as much as usual. Summary  High blood pressure (hypertension) is when the force of blood pumping through the arteries is too strong.  Hypertension during pregnancy can cause problems for you and your baby.  Treatment for hypertension during pregnancy varies depending on the type of hypertension you have and how serious it is.  Keep all prenatal and follow-up visits as told by your health care provider. This is important. This information is not intended to replace advice given to you by your health care provider. Make sure you discuss any questions you have with your health care provider. Document Released: 06/19/2011 Document Revised: 01/22/2019 Document Reviewed: 10/28/2018 Elsevier Patient Education  2020 [employer].

## 2019-06-02 NOTE — Telephone Encounter (Signed)
Left message to call [facility] to schedule BP check this week from ED visit

## 2019-06-02 NOTE — MAU Note (Signed)
Pt states that she started leaking fluid yesterday around 1200-1300 pt states she only sees it when she goes to the bathroom because her underwear is wet.   Pt reports clear fluid with no smell.   Denies vaginal bleeding.   Reports +FM

## 2019-06-02 NOTE — MAU Provider Note (Signed)
Chief Complaint:  Rupture of Membranes   First Provider Initiated Contact with Patient 06/02/19 0049     HPI: Danielle Waters is a 28 y.o. G2P0010 at 2642w4d who presents to maternity admissions reporting leaking of fluid this evening. States notices when she uses the bathroom her underwear looks wet. Can't tell what it looks like. No odor. Denies abdominal pain, vaginal bleeding, dysuria, or recent intercourse. Good fetal movement. Denies history of hypertension. No headache, visual disturbance, or epigastric pain. .   Past Medical History:  Diagnosis Date  . Influenza A   . Medical history non-contributory    OB History  Gravida Para Term Preterm AB Living  2 0 0 0 1 0  SAB TAB Ectopic Multiple Live Births  1 0 0 0 0    # Outcome Date GA Lbr Len/2nd Weight Sex Delivery Anes PTL Lv  2 Current           1 SAB 05/2018           Past Surgical History:  Procedure Laterality Date  . APPENDECTOMY    . DILATION AND CURETTAGE OF UTERUS    . LAPAROSCOPIC APPENDECTOMY N/A 10/30/2018   Procedure: APPENDECTOMY LAPAROSCOPIC;  Surgeon: Henrene DodgePiscoya, Jose, MD;  Location: [facility] ORS;  Service: General;  Laterality: N/A;   Family History  Problem Relation Age of Onset  . Diabetes Mother   . Hypertension Mother   . Diabetes Maternal Grandmother   . Hypertension Maternal Grandmother    Social History   Tobacco Use  . Smoking status: Never Smoker  . Smokeless tobacco: Never Used  Substance Use Topics  . Alcohol use: Not Currently  . Drug use: No   No Known Allergies No medications prior to admission.    I have reviewed patient's Past Medical Hx, Surgical Hx, Family Hx, Social Hx, medications and allergies.   ROS:  Review of Systems  Constitutional: Negative.   Gastrointestinal: Negative.   Genitourinary: Positive for vaginal discharge. Negative for vaginal bleeding.    Physical Exam   Patient Vitals for the past 24 hrs:  BP Pulse Resp SpO2 Weight  06/02/19 0201 131/74 67 - - -   06/02/19 0146 (!) 145/76 (!) 59 - - -  06/02/19 0131 (!) 147/78 (!) 58 - - -  06/02/19 0116 (!) 145/81 67 - - -  06/02/19 0101 137/81 71 - - -  06/02/19 0058 (!) 149/87 (!) 58 - - -  06/02/19 0046 140/74 63 - - -  06/02/19 0030 (!) 150/88 61 - 100 % -  06/02/19 0021 (!) 145/80 (!) 54 - - -  06/02/19 0019 (!) 145/80 (!) 54 20 99 % -  06/02/19 0012 - - - - 95.5 kg    Constitutional: Well-developed, well-nourished female in no acute distress.  Cardiovascular: normal rate & rhythm, no murmur Respiratory: normal effort, lung sounds clear throughout GI: Abd soft, non-tender, gravid appropriate for gestational age. Pos BS x 4 MS: Extremities nontender, no edema, normal ROM Neurologic: Alert and oriented x 4.  GU:    SSE: no pooling. No blood. Cervix visually closed.   NST:  Baseline: 120 bpm, Variability: Good {> 6 bpm), Accelerations: Reactive and Decelerations: Absent   Labs: Results for orders placed or performed during the hospital encounter of 06/01/19 (from the past 24 hour(s))  CBC     Status: Abnormal   Collection Time: 06/02/19  1:10 AM  Result Value Ref Range   WBC 7.0 4.0 - 10.5 K/uL  RBC 3.10 (L) 3.87 - 5.11 MIL/uL   Hemoglobin 10.5 (L) 12.0 - 15.0 g/dL   HCT 04.531.0 (L) 40.936.0 - 81.146.0 %   MCV 100.0 80.0 - 100.0 fL   MCH 33.9 26.0 - 34.0 pg   MCHC 33.9 30.0 - 36.0 g/dL   RDW 91.413.0 78.211.5 - 95.615.5 %   Platelets 240 150 - 400 K/uL   nRBC 0.0 0.0 - 0.2 %  Comprehensive metabolic panel     Status: Abnormal   Collection Time: 06/02/19  1:10 AM  Result Value Ref Range   Sodium 136 135 - 145 mmol/L   Potassium 3.1 (L) 3.5 - 5.1 mmol/L   Chloride 105 98 - 111 mmol/L   CO2 21 (L) 22 - 32 mmol/L   Glucose, Bld 87 70 - 99 mg/dL   BUN 5 (L) 6 - 20 mg/dL   Creatinine, Ser 2.130.59 0.44 - 1.00 mg/dL   Calcium 8.6 (L) 8.9 - 10.3 mg/dL   Total Protein 6.0 (L) 6.5 - 8.1 g/dL   Albumin 2.7 (L) 3.5 - 5.0 g/dL   AST 14 (L) 15 - 41 U/L   ALT 13 0 - 44 U/L   Alkaline Phosphatase 62 38 - 126  U/L   Total Bilirubin 0.4 0.3 - 1.2 mg/dL   GFR calc non Af Amer >60 >60 mL/min   GFR calc Af Amer >60 >60 mL/min   Anion gap 10 5 - 15  Protein / creatinine ratio, urine     Status: None   Collection Time: 06/02/19  1:17 AM  Result Value Ref Range   Creatinine, Urine 37.38 mg/dL   Total Protein, Urine <6 mg/dL   Protein Creatinine Ratio        0.00 - 0.15 mg/mg[Cre]  POCT fern test     Status: None   Collection Time: 06/02/19  1:25 AM  Result Value Ref Range   POCT Fern Test Negative = intact amniotic membranes     Imaging:  No results found.  MAU Course: Orders Placed This Encounter  Procedures  . CBC  . Comprehensive metabolic panel  . Protein / creatinine ratio, urine  . POCT fern test  . Discharge patient   No orders of the defined types were placed in this encounter.   MDM: Reactive NST No pooling on exam & fern negative.   Pt with elevated BPs. No history. No symptoms. PEC labs negative & no severe range BPs. Will get BP check later this week in the office.  Assessment: 1. Elevated BP without diagnosis of hypertension   2. [redacted] weeks gestation of pregnancy   3. Encounter for suspected PROM, with rupture of membranes not found     Plan: Discharge home in stable condition.  Preterm Labor & PEC precautions and fetal kick counts Msg to [facility] for BP check later this week  Follow-up Information    [facility] up.   Specialty: Obstetrics and Gynecology Why: the office will call you to schedule blood pressure check later this week Contact information: [address] [place] [phone]       [facility] Follow up.   Specialty: Obstetrics and Gynecology Why: return for worsening symptoms Contact information: [address] [id number] [place] [phone]          Allergies as of 06/02/2019   No Known Allergies      Medication List  STOP taking these medications   Alka-Seltzer Plus Cold 2-7.8-325 MG Tbef Generic drug: Chlorphen-Phenyleph-ASA   Metamucil 0.52 g capsule Generic drug: psyllium   metFORMIN 1000 MG tablet Commonly known as: GLUCOPHAGE   metroNIDAZOLE 250 MG tablet Commonly known as: FLAGYL     TAKE these medications   ferrous sulfate 325 (65 FE) MG tablet Commonly known as: FerrouSul Take 1 tablet (325 mg total) by mouth daily with breakfast.   multivitamin-prenatal 27-0.8 MG Tabs tablet Take 1 tablet by mouth daily at 12 noon.   valACYclovir 500 MG tablet Commonly known as: VALTREX Take 1 tablet (500 mg total) by mouth 2 (two) times daily.       Jorje Guild, NP 06/02/2019 4:09 AM

## 2019-06-04 ENCOUNTER — Other Ambulatory Visit: Payer: Self-pay

## 2019-06-04 ENCOUNTER — Ambulatory Visit (INDEPENDENT_AMBULATORY_CARE_PROVIDER_SITE_OTHER): Payer: BC Managed Care – PPO | Admitting: *Deleted

## 2019-06-04 VITALS — BP 130/81

## 2019-06-04 DIAGNOSIS — O0990 Supervision of high risk pregnancy, unspecified, unspecified trimester: Secondary | ICD-10-CM

## 2019-06-04 NOTE — Progress Notes (Signed)
Subjective:  Danielle Waters is a 28 y.o. female here for BP check.   Hypertension ROS: Denies and SOB, headaches or vision changes.   Objective:  BP 130/81   Appearance alert, well appearing, and in no distress. General exam BP noted to be well controlled today in office.    Assessment:   Blood Pressure well controlled.   Plan:  Follow up at next [facility]

## 2019-06-04 NOTE — Progress Notes (Signed)
Attestation of Attending Supervision of clinical support staff: I agree with the care provided to this patient and was available for any consultation.  I have reviewed the RN's note and chart. I was available for consult and to see the patient if needed.   Carrine Kroboth Niles Ezariah Nace, MD, MPH, ABFM Attending Physician Faculty Practice- [facility]  

## 2019-06-11 ENCOUNTER — Ambulatory Visit (INDEPENDENT_AMBULATORY_CARE_PROVIDER_SITE_OTHER): Payer: BC Managed Care – PPO | Admitting: Family Medicine

## 2019-06-11 ENCOUNTER — Other Ambulatory Visit (HOSPITAL_COMMUNITY)
Admission: RE | Admit: 2019-06-11 | Discharge: 2019-06-11 | Disposition: A | Discharge status: Home / Self Care | Payer: BC Managed Care – PPO | Source: Ambulatory Visit | Attending: Family Medicine | Admitting: Family Medicine

## 2019-06-11 ENCOUNTER — Other Ambulatory Visit: Payer: Self-pay

## 2019-06-11 VITALS — BP 136/81 | HR 67 | Wt 208.0 lb

## 2019-06-11 DIAGNOSIS — O0990 Supervision of high risk pregnancy, unspecified, unspecified trimester: Secondary | ICD-10-CM | POA: Diagnosis present

## 2019-06-11 DIAGNOSIS — Z3A35 35 weeks gestation of pregnancy: Secondary | ICD-10-CM

## 2019-06-11 DIAGNOSIS — O0993 Supervision of high risk pregnancy, unspecified, third trimester: Secondary | ICD-10-CM

## 2019-06-11 DIAGNOSIS — O99210 Obesity complicating pregnancy, unspecified trimester: Secondary | ICD-10-CM

## 2019-06-11 DIAGNOSIS — R768 Other specified abnormal immunological findings in serum: Secondary | ICD-10-CM

## 2019-06-11 DIAGNOSIS — O99213 Obesity complicating pregnancy, third trimester: Secondary | ICD-10-CM

## 2019-06-11 NOTE — Progress Notes (Signed)
   PRENATAL VISIT NOTE  Subjective:  Danielle Waters is a 28 y.o. G2P0010 at [redacted]w[redacted]d being seen today for ongoing prenatal care.  She is currently monitored for the following issues for this low-risk pregnancy and has Obesity (BMI 30-39.9); Transient hypertension; PCOS (polycystic ovarian syndrome); Pre-diabetes; Supervision of high risk pregnancy, antepartum; Obesity affecting pregnancy, antepartum; Herpes exposure; HSV-2 seropositive; and Pelvic pain affecting pregnancy on their problem list.  Patient reports no complaints.  Contractions: Irritability. Vag. Bleeding: None.  Movement: Present. Denies leaking of fluid.   The following portions of the patient's history were reviewed and updated as appropriate: allergies, current medications, past family history, past medical history, past social history, past surgical history and problem list.   Objective:   Vitals:   06/11/19 0919  BP: 136/81  Pulse: 67  Weight: 208 lb (94.3 kg)    Fetal Status: Fetal Heart Rate (bpm): 125   Movement: Present     General:  Alert, oriented and cooperative. Patient is in no acute distress.  Skin: Skin is warm and dry. No rash noted.   Cardiovascular: Normal heart rate noted  Respiratory: Normal respiratory effort, no problems with respiration noted  Abdomen: Soft, gravid, appropriate for gestational age.  Pain/Pressure: Absent     Pelvic: Cervical exam deferred        Extremities: Normal range of motion.  Edema: Mild pitting, slight indentation  Mental Status: Normal mood and affect. Normal behavior. Normal judgment and thought content.   Assessment and Plan:  Pregnancy: G2P0010 at [redacted]w[redacted]d 1. Supervision of high risk pregnancy, antepartum Up to date currently  Discussed increased BP at 130/80s. Reviewed s/sx of preeclampsia and need to return to care if BP at home is >140/90  Reviewed to go to MAU if needed   2. HSV-2 seropositive Started PPX  3. Obesity affecting pregnancy, antepartum TWG= 53 lb  (24 kg)   Preterm labor symptoms and general obstetric precautions including but not limited to vaginal bleeding, contractions, leaking of fluid and fetal movement were reviewed in detail with the patient. Please refer to After Visit Summary for other counseling recommendations.   No follow-ups on file.  Future Appointments  Date Time Provider [place]  06/19/2019  1:30 PM [facility]-MFC [place] 1 WH-MFCUS MFC-US    Caren Macadam, MD

## 2019-06-11 NOTE — Progress Notes (Signed)
Pt presents for ORB. Pt has no other concerns today. Pt desires to have cervix checked today. Pt declines flu vaccine today.

## 2019-06-12 ENCOUNTER — Telehealth: Payer: Self-pay | Admitting: Internal Medicine

## 2019-06-12 NOTE — Telephone Encounter (Signed)
Received phone call from [employer] for patient BP of 135/91 today. Called patient to discuss. Patient reports that she is feeling well. Denies headaches, vision changes, and RUQ pain. Reports this is the highest diastolic pressure she has seen at home. No systolic pressures above 655 at home but reports she has seen the numbers starting to rise this week and has been worried.   Precautions for pre-eclampsia reviewed. Instructed patient to come to MAU for any severe range pressures or symptoms of pre-eclampsia. Asked her to record BP at least once per day. Discussed that given her elevated BPs in MAU visit a couple weeks ago, likely warrants diagnosis of gestational HTN and may need IOL at 37 weeks. Has a virtual visit scheduled for 9/3. Asked patient to call clinic on Monday to check in and to see if needs induction posted.   Phill Myron, D.O. [facility] Family Medicine Fellow, [facility] for [facility], [facility] 06/12/2019, 6:48 PM

## 2019-06-13 ENCOUNTER — Encounter: Payer: Self-pay | Admitting: Obstetrics and Gynecology

## 2019-06-13 ENCOUNTER — Encounter (HOSPITAL_COMMUNITY): Payer: Self-pay | Admitting: *Deleted

## 2019-06-13 ENCOUNTER — Inpatient Hospital Stay (HOSPITAL_COMMUNITY)
Admission: AD | Admit: 2019-06-13 | Discharge: 2019-06-16 | DRG: 806 | Disposition: A | Discharge status: Home / Self Care | Payer: BC Managed Care – PPO | Attending: Obstetrics & Gynecology | Admitting: Obstetrics & Gynecology

## 2019-06-13 ENCOUNTER — Other Ambulatory Visit: Payer: Self-pay

## 2019-06-13 DIAGNOSIS — O99344 Other mental disorders complicating childbirth: Secondary | ICD-10-CM | POA: Diagnosis present

## 2019-06-13 DIAGNOSIS — O0990 Supervision of high risk pregnancy, unspecified, unspecified trimester: Secondary | ICD-10-CM

## 2019-06-13 DIAGNOSIS — O1415 Severe pre-eclampsia, complicating the puerperium: Secondary | ICD-10-CM | POA: Diagnosis not present

## 2019-06-13 DIAGNOSIS — Z20828 Contact with and (suspected) exposure to other viral communicable diseases: Secondary | ICD-10-CM | POA: Diagnosis present

## 2019-06-13 DIAGNOSIS — Z8759 Personal history of other complications of pregnancy, childbirth and the puerperium: Secondary | ICD-10-CM | POA: Diagnosis present

## 2019-06-13 DIAGNOSIS — A6000 Herpesviral infection of urogenital system, unspecified: Secondary | ICD-10-CM | POA: Diagnosis present

## 2019-06-13 DIAGNOSIS — F419 Anxiety disorder, unspecified: Secondary | ICD-10-CM | POA: Diagnosis present

## 2019-06-13 DIAGNOSIS — O9832 Other infections with a predominantly sexual mode of transmission complicating childbirth: Secondary | ICD-10-CM | POA: Diagnosis present

## 2019-06-13 DIAGNOSIS — O99214 Obesity complicating childbirth: Secondary | ICD-10-CM | POA: Diagnosis present

## 2019-06-13 DIAGNOSIS — E669 Obesity, unspecified: Secondary | ICD-10-CM | POA: Diagnosis present

## 2019-06-13 DIAGNOSIS — Z3A36 36 weeks gestation of pregnancy: Secondary | ICD-10-CM

## 2019-06-13 DIAGNOSIS — O1414 Severe pre-eclampsia complicating childbirth: Secondary | ICD-10-CM | POA: Diagnosis present

## 2019-06-13 DIAGNOSIS — R0602 Shortness of breath: Secondary | ICD-10-CM | POA: Diagnosis not present

## 2019-06-13 DIAGNOSIS — O99210 Obesity complicating pregnancy, unspecified trimester: Secondary | ICD-10-CM

## 2019-06-13 DIAGNOSIS — O1413 Severe pre-eclampsia, third trimester: Secondary | ICD-10-CM

## 2019-06-13 LAB — CBC
HCT: 32.4 % — ABNORMAL LOW (ref 36.0–46.0)
Hemoglobin: 11.2 g/dL — ABNORMAL LOW (ref 12.0–15.0)
MCH: 34.8 pg — ABNORMAL HIGH (ref 26.0–34.0)
MCHC: 34.6 g/dL (ref 30.0–36.0)
MCV: 100.6 fL — ABNORMAL HIGH (ref 80.0–100.0)
Platelets: 245 10*3/uL (ref 150–400)
RBC: 3.22 MIL/uL — ABNORMAL LOW (ref 3.87–5.11)
RDW: 12.9 % (ref 11.5–15.5)
WBC: 6.8 10*3/uL (ref 4.0–10.5)
nRBC: 0 % (ref 0.0–0.2)

## 2019-06-13 LAB — COMPREHENSIVE METABOLIC PANEL
ALT: 13 U/L (ref 0–44)
AST: 15 U/L (ref 15–41)
Albumin: 2.7 g/dL — ABNORMAL LOW (ref 3.5–5.0)
Alkaline Phosphatase: 75 U/L (ref 38–126)
Anion gap: 10 (ref 5–15)
BUN: <5 mg/dL — ABNORMAL LOW (ref 6–20)
CO2: 22 mmol/L (ref 22–32)
Calcium: 8.7 mg/dL — ABNORMAL LOW (ref 8.9–10.3)
Chloride: 104 mmol/L (ref 98–111)
Creatinine, Ser: 0.55 mg/dL (ref 0.44–1.00)
GFR calc Af Amer: >60 mL/min (ref >60)
GFR calc non Af Amer: >60 mL/min (ref >60)
Glucose, Bld: 89 mg/dL (ref 70–99)
Potassium: 3.1 mmol/L — ABNORMAL LOW (ref 3.5–5.1)
Sodium: 136 mmol/L (ref 135–145)
Total Bilirubin: 0.4 mg/dL (ref 0.3–1.2)
Total Protein: 6 g/dL — ABNORMAL LOW (ref 6.5–8.1)

## 2019-06-13 LAB — URINALYSIS, ROUTINE W REFLEX MICROSCOPIC
Bilirubin Urine: NEGATIVE
Glucose, UA: NEGATIVE mg/dL
Hgb urine dipstick: NEGATIVE
Ketones, ur: NEGATIVE mg/dL
Leukocytes,Ua: NEGATIVE
Nitrite: NEGATIVE
Protein, ur: NEGATIVE mg/dL
Specific Gravity, Urine: 1.003 — ABNORMAL LOW (ref 1.005–1.030)
pH: 7 (ref 5.0–8.0)

## 2019-06-13 LAB — SARS CORONAVIRUS 2 BY RT PCR (HOSPITAL ORDER, PERFORMED IN ~~LOC~~ HOSPITAL LAB): SARS Coronavirus 2: NEGATIVE

## 2019-06-13 LAB — TYPE AND SCREEN
ABO/RH(D): O POS
Antibody Screen: NEGATIVE

## 2019-06-13 LAB — ABO/RH: ABO/RH(D): O POS

## 2019-06-13 LAB — PROTEIN / CREATININE RATIO, URINE
Creatinine, Urine: 33.96 mg/dL
Total Protein, Urine: <6 mg/dL

## 2019-06-13 LAB — RPR: RPR Ser Ql: NONREACTIVE

## 2019-06-13 LAB — STREP GP B NAA: Strep Gp B NAA: NEGATIVE

## 2019-06-13 MED ORDER — TERBUTALINE SULFATE 1 MG/ML IJ SOLN: 0.2500 mg | Freq: Once | INTRAMUSCULAR | Status: DC | PRN

## 2019-06-13 MED ORDER — CYCLOBENZAPRINE HCL 10 MG PO TABS: 10.0000 mg | ORAL_TABLET | Freq: Once | ORAL | Status: DC

## 2019-06-13 MED ORDER — LABETALOL HCL 5 MG/ML IV SOLN
40.0000 mg | INTRAVENOUS | Status: DC | PRN
  Administered 2019-06-13: 40 mg via INTRAVENOUS
  Filled 2019-06-13: qty 8

## 2019-06-13 MED ORDER — MISOPROSTOL 50MCG HALF TABLET
50.0000 ug | ORAL_TABLET | ORAL | Status: DC | PRN
  Administered 2019-06-13 – 2019-06-14 (×4): 50 ug via ORAL
  Filled 2019-06-13 (×5): qty 1

## 2019-06-13 MED ORDER — PENICILLIN G 3 MILLION UNITS IVPB - SIMPLE MED
3.0000 10*6.[IU] | INTRAVENOUS | Status: DC
  Filled 2019-06-13 (×3): qty 100

## 2019-06-13 MED ORDER — ACETAMINOPHEN 325 MG PO TABS
650.0000 mg | ORAL_TABLET | ORAL | Status: DC | PRN
  Administered 2019-06-13 (×2): 650 mg via ORAL
  Filled 2019-06-13 (×2): qty 2

## 2019-06-13 MED ORDER — LABETALOL HCL 5 MG/ML IV SOLN: 80.0000 mg | INTRAVENOUS | Status: DC | PRN

## 2019-06-13 MED ORDER — DEXAMETHASONE SODIUM PHOSPHATE 10 MG/ML IJ SOLN
10.0000 mg | Freq: Once | INTRAMUSCULAR | Status: AC
  Administered 2019-06-13: 10 mg via INTRAVENOUS
  Filled 2019-06-13: qty 1

## 2019-06-13 MED ORDER — LABETALOL HCL 5 MG/ML IV SOLN
20.0000 mg | INTRAVENOUS | Status: DC | PRN
  Administered 2019-06-13: 04:00:00 20 mg via INTRAVENOUS
  Filled 2019-06-13: qty 4

## 2019-06-13 MED ORDER — ZOLPIDEM TARTRATE 5 MG PO TABS
5.0000 mg | ORAL_TABLET | Freq: Every evening | ORAL | Status: DC | PRN
  Administered 2019-06-13: 5 mg via ORAL
  Filled 2019-06-13: qty 1

## 2019-06-13 MED ORDER — SOD CITRATE-CITRIC ACID 500-334 MG/5ML PO SOLN: 30.0000 mL | ORAL | Status: DC | PRN

## 2019-06-13 MED ORDER — HYDRALAZINE HCL 20 MG/ML IJ SOLN: 10.0000 mg | INTRAMUSCULAR | Status: DC | PRN

## 2019-06-13 MED ORDER — LACTATED RINGERS IV SOLN: 500.0000 mL | INTRAVENOUS | Status: DC | PRN

## 2019-06-13 MED ORDER — MISOPROSTOL 25 MCG QUARTER TABLET
25.0000 ug | ORAL_TABLET | ORAL | Status: DC | PRN
  Administered 2019-06-13: 09:00:00 25 ug via VAGINAL
  Filled 2019-06-13 (×2): qty 1

## 2019-06-13 MED ORDER — METOCLOPRAMIDE HCL 5 MG/ML IJ SOLN
10.0000 mg | Freq: Once | INTRAMUSCULAR | Status: AC
  Administered 2019-06-13: 04:00:00 10 mg via INTRAVENOUS
  Filled 2019-06-13: qty 2

## 2019-06-13 MED ORDER — LACTATED RINGERS IV SOLN
INTRAVENOUS | Status: DC
  Administered 2019-06-13 – 2019-06-14 (×4): via INTRAVENOUS

## 2019-06-13 MED ORDER — LIDOCAINE HCL (PF) 1 % IJ SOLN: 30.0000 mL | INTRAMUSCULAR | Status: DC | PRN

## 2019-06-13 MED ORDER — OXYCODONE-ACETAMINOPHEN 5-325 MG PO TABS: 1.0000 | ORAL_TABLET | ORAL | Status: DC | PRN

## 2019-06-13 MED ORDER — OXYTOCIN BOLUS FROM INFUSION
500.0000 mL | Freq: Once | INTRAVENOUS | Status: AC
  Administered 2019-06-14: 500 mL via INTRAVENOUS

## 2019-06-13 MED ORDER — SODIUM CHLORIDE 0.9 % IV SOLN
5.0000 10*6.[IU] | Freq: Once | INTRAVENOUS | Status: AC
  Administered 2019-06-13: 06:00:00 5 10*6.[IU] via INTRAVENOUS
  Filled 2019-06-13: qty 5

## 2019-06-13 MED ORDER — MAGNESIUM SULFATE BOLUS VIA INFUSION
4.0000 g | Freq: Once | INTRAVENOUS | Status: AC
  Administered 2019-06-13: 05:00:00 4 g via INTRAVENOUS
  Filled 2019-06-13: qty 500

## 2019-06-13 MED ORDER — ONDANSETRON HCL 4 MG/2ML IJ SOLN: 4.0000 mg | Freq: Four times a day (QID) | INTRAMUSCULAR | Status: DC | PRN

## 2019-06-13 MED ORDER — OXYCODONE-ACETAMINOPHEN 5-325 MG PO TABS: 2.0000 | ORAL_TABLET | ORAL | Status: DC | PRN

## 2019-06-13 MED ORDER — LACTATED RINGERS IV SOLN
INTRAVENOUS | Status: DC
  Administered 2019-06-14 (×2): via INTRAVENOUS

## 2019-06-13 MED ORDER — OXYTOCIN 40 UNITS IN NORMAL SALINE INFUSION - SIMPLE MED
2.5000 [IU]/h | INTRAVENOUS | Status: DC
  Administered 2019-06-14: 16:00:00 2.5 [IU]/h via INTRAVENOUS

## 2019-06-13 MED ORDER — MAGNESIUM SULFATE 40 G IN LACTATED RINGERS - SIMPLE
2.0000 g/h | INTRAVENOUS | Status: DC
  Administered 2019-06-13 – 2019-06-14 (×2): 2 g/h via INTRAVENOUS
  Filled 2019-06-13 (×3): qty 500

## 2019-06-13 MED ORDER — DIPHENHYDRAMINE HCL 50 MG/ML IJ SOLN
25.0000 mg | Freq: Once | INTRAMUSCULAR | Status: AC
  Administered 2019-06-13: 25 mg via INTRAVENOUS
  Filled 2019-06-13: qty 1

## 2019-06-13 MED ORDER — BETAMETHASONE SOD PHOS & ACET 6 (3-3) MG/ML IJ SUSP
12.0000 mg | INTRAMUSCULAR | Status: AC
  Administered 2019-06-13 – 2019-06-14 (×2): 12 mg via INTRAMUSCULAR
  Filled 2019-06-13 (×3): qty 2

## 2019-06-13 NOTE — Progress Notes (Signed)
Received call from [employer] regarding elevated BP, however they did not give the number to the night RN, and the return phone number was not working. While attempting to contact [employer], patient presented to MAU. See MAU notes.

## 2019-06-13 NOTE — Progress Notes (Addendum)
Danielle Waters is a 28 y.o. G2P0010 at [redacted]w[redacted]d presented for IOL for pre-eclampsia.   Subjective: Feeling comfortable, denies pain with contractions. Denies headache. Feeling anxious about foley catheter as did not tolerate on 1st attempt.   Objective: BP (!) 148/75   Pulse 76   Temp 98.6 F (37 C) (Oral)   Resp 17   Ht 5\' 3"  (1.6 m)   Wt 94.3 kg   BMI 36.85 kg/m  I/O last 3 completed shifts: In: 4420.1 [P.O.:2170; I.V.:1721; IV Piggyback:529.2] Out: 5550 [Urine:5550] Total I/O In: 1197.4 [P.O.:900; I.V.:297.4] Out: 700 [Urine:700]  FHT:  FHR: 115 bpm, variability: moderate,  accelerations:  Present,  decelerations:  Absent UC:   regular, every 2.5-3 minutes SVE:   Dilation: 1 Effacement (%): 50 Station: -2 Exam by:: Dr. Posey Pronto Resident   Labs: Lab Results  Component Value Date   WBC 6.8 06/13/2019   HGB 11.2 (L) 06/13/2019   HCT 32.4 (L) 06/13/2019   MCV 100.6 (H) 06/13/2019   PLT 245 06/13/2019    Assessment / Plan: 28 y.o. G2P0010 [redacted]w[redacted]d presenting for IOL for pre-eclampsia  Labor: Progressing normally S/P Cytotec X 4. Making cervical change, will check cervix again in 4hrs and consider foley bulb if pt tolerates. Consider Pit/AROM. Preeclampsia:  on magnesium sulfate and no signs or symptoms of toxicity no headache at present. Continue to monitor for severe range pressures.  Fetal Wellbeing:  Category I Pain Control:  Labor support without medications. Would like epidural in active stage of labor.  I/D:  gbs urine culture negative. stop pencillin  Anticipated MOD:  NSVD anticipated, progress to c-section if maternal/fetal indication. Anxiety: ambien prescribed   Lattie Haw MD PGY-1, [facility]  06/13/2019, 9:24 PM

## 2019-06-13 NOTE — Progress Notes (Addendum)
Labor Progress Note Danielle Waters is a 28 y.o. G2P0010 at [redacted]w[redacted]d presented for IOL for pre-eclampsia.    S: Patient reports doing well. Feels pressure, but no pain with contractions. Also had a headache, but this was relieved by some tylenol. No visual changes, right upper quadrant pain, or new swelling. She is feeling baby move.  O:  BP 140/66   Pulse 71   Temp 97.7 F (36.5 C) (Axillary)   Resp 20   Ht 5\' 3"  (1.6 m)   Wt 94.3 kg   BMI 36.85 kg/m  NST: baseline 110, accelerations noted, moderate variability  CVE: Dilation: Fingertip Effacement (%): 50 Station: -2 Presentation: Vertex Exam by:: Dr Darene Lamer   A&P: 28 y.o. G2P0010 [redacted]w[redacted]d presenting for IOL for pre-eclampsia #Labor: Making cervical change. Will give 3rd dose of Cytotec. Consider foley bulb at next check, consider Pit/AROM.  #Pain: headaches controlled with tylenol. Desires epidural when contractions become painful. #FWB: category 1 tracing, reassuring fetal status #GBS positive #Pre-Eclampsia: continue mag, monitor for severe range pressures  Phylliss Blakes, Medical Student 5:03 PM  GME ATTESTATION:  I saw and evaluated the patient with the medical student. I agree with the findings and the plan of care as documented in the medical student's note.  Merilyn Baba, DO OB Fellow [place] for [place] 06/13/19, [phone]

## 2019-06-13 NOTE — Progress Notes (Signed)
LABOR PROGRESS NOTE   Danielle Waters is a 28 y.o. G1P0 at [redacted]w[redacted]d  admitted for IOL 2/2 pre-eclampsia with severe features.   Subjective: Patient is comfortable, was feeling some mild contractions earlier, but those have stopped.   She denies visual changes, headaches, or right upper quadrant pain. She denies new LE swelling. She is feeling baby move.   Objective: Patient is resting comfortably Heart rate is regular Breathing unlabored Abdomen is gravid, no tenderness to palpation of the right upper quadrant Lower Extremities show no LE edema  Dilation: 0 Effacement (%): Thick/Ballotable Cervical Position: Posterior Station: -3 Presentation: Vertex Exam by: Dr. Darene Lamer FHT: baseline rate 125, moderate varibility, accels, no decels Toco: CTX irregular, some 5-6 minutes   Labs: ABO: O+ RPR: nonreactive UA: negative PCR: pending   Assessment / Plan: Danielle Waters is a 28 y.o. G1P0 at [redacted]w[redacted]d  admitted for IOL 2/2 pre-eclampsia with severe features.   Labor: continue with cytotec q4h (buccal instead of vaginal), consider foley bulb/AROM/pitocin when appropriate, continue time-appropriate cervical exams.    Fetal Wellbeing:  Fetal status reassuring. Cephalic by Leopold's and SVE.  -- continuous fetal monitoring    Pain Control:  Desires epidural when contractions become painful   Anticipated MOD:  Anticipate cervical ripening, C/S as appropriate.   Mathis Dad, D.O. OB Fellow

## 2019-06-13 NOTE — MAU Note (Addendum)
PT SAYS SHE CALLED ON CALL RN - TOLD  TO COME  BP AT HOME  WAS 144/80 AND  136/106 .    HAS  H/A - STARTED  AT 0030-  TOOK 2 XS TYLENOL - NO RELIEF.   NO UC'S .   NO BLURRED VISION-NO EPIGASTRIC PAIN   SAYS [facility] - VE ON WED WAS CLOSED

## 2019-06-13 NOTE — MAU Note (Signed)
Covid swab obtained without difficulty and pt tol well. No symptoms 

## 2019-06-13 NOTE — H&P (Signed)
OBSTETRIC ADMISSION HISTORY AND PHYSICAL  Danielle Waters is a 28 y.o. female G2P0010 with IUP at [redacted]w[redacted]d by [place] presenting for IOL for pre-eclampsia with severe features.   Has been having high BPs over the last 2 weeks. Had frontal headache yesterday which did not improve with tylenol. Bps 144/80 at home so called [facility] who advised pt to come into hospital. Denies visual changes, LE edema or RUQ pain. Denies vomiting, chest pain, SOB, cough, dizziness, palpitations, voiding sx or change in bowel habit.  Reports fetal movement. Denies vaginal bleeding or discharge.  She received her prenatal care at [facility].  Support person in labor: partner ([place])  Ultrasounds . Anatomy U/S: WNL  Prenatal History/Complications: . Pre-eclampsia . HSV seropositive . PCOS . Pre-diabetes  . Obesity (BMI 30-39.9)  Past Medical History: Past Medical History:  Diagnosis Date  . Influenza A   . Medical history non-contributory     Past Surgical History: Past Surgical History:  Procedure Laterality Date  . APPENDECTOMY    . DILATION AND CURETTAGE OF UTERUS    . LAPAROSCOPIC APPENDECTOMY N/A 10/30/2018   Procedure: APPENDECTOMY LAPAROSCOPIC;  Surgeon: Henrene Dodge, MD;  Location: [facility] ORS;  Service: General;  Laterality: N/A;    Obstetrical History: OB History    Gravida  2   Para  0   Term  0   Preterm  0   AB  1   Living  0     SAB  1   TAB  0   Ectopic  0   Multiple  0   Live Births  0           Social History: Social History   Socioeconomic History  . Marital status: Single    Spouse name: Not on file  . Number of children: Not on file  . Years of education: Not on file  . Highest education level: Not on file  Occupational History  . Not on file  Social Needs  . Financial resource strain: Not hard at all  . Food insecurity    Worry: Never true    Inability: Never true  . Transportation needs    Medical: No    Non-medical: No  Tobacco Use  . Smoking  status: Never Smoker  . Smokeless tobacco: Never Used  Substance and Sexual Activity  . Alcohol use: Not Currently  . Drug use: No  . Sexual activity: Yes    Birth control/protection: None  Lifestyle  . Physical activity    Days per week: Not on file    Minutes per session: Not on file  . Stress: Not on file  Relationships  . Social Musician on phone: Not on file    Gets together: Not on file    Attends religious service: Not on file    Active member of club or organization: Not on file    Attends meetings of clubs or organizations: Not on file    Relationship status: Not on file  Other Topics Concern  . Not on file  Social History Narrative  . Not on file    Family History: Family History  Problem Relation Age of Onset  . Diabetes Mother   . Hypertension Mother   . Diabetes Maternal Grandmother   . Hypertension Maternal Grandmother     Allergies: No Known Allergies  Medications Prior to Admission  Medication Sig Dispense Refill Last Dose  . ferrous sulfate (FERROUSUL) 325 (65 FE) MG tablet Take  1 tablet (325 mg total) by mouth daily with breakfast. 90 tablet 1 06/12/2019 at Unknown time  . Prenatal Vit-Fe Fumarate-FA (MULTIVITAMIN-PRENATAL) 27-0.8 MG TABS tablet Take 1 tablet by mouth daily at 12 noon.   06/12/2019 at Unknown time  . valACYclovir (VALTREX) 500 MG tablet Take 1 tablet (500 mg total) by mouth 2 (two) times daily. 60 tablet 6 NOT TAKING YET     Review of Systems  All systems reviewed and negative except as stated in HPI  Blood pressure 122/62, pulse 67, temperature 97.7 F (36.5 C), temperature source Axillary, resp. rate 20, height 5\' 3"  (1.6 m), weight 94.3 kg. General appearance: alert, cooperative and appears stated age Lungs: lungs clear on auscultation, no respiratory distress Heart: RRR, no rubs or gallops, normal cap refill  Abdomen: soft, non-tender; gravid abdomen, bowel sounds present DTR 2+ biceps, triceps and patella   Pelvic: not checked yet  Extremities:calves soft non tender, bilateral edema, no sign of DVT Presentation: cephalic by Leopold's and bedside ultrasound Fetal monitoring: 120, moderate variability, accelerations present, no decels  Uterine activity:  Occasional contractions, mild  Dilation: Closed Effacement (%): Thick Station: Ballotable Exam by:: s grindstaff rn  Prenatal labs: ABO, Rh: --/--/O POS, O POS Performed at [facility], [address], [place], [place] [address]  [id number]/29 0345) Antibody: NEG (08/29 0345) Rubella: 2.22 (04/01 0919) RPR: Non Reactive (06/30 0822)  HBsAg: Negative (04/01 0919)  HIV: Non Reactive (06/30 2025)  GBS: Negative (08/27 1032)  Negative  Glucola: normal  Genetic screening: declined   Prenatal Transfer Tool  Maternal Diabetes: No Genetic Screening: Declined Maternal Ultrasounds/Referrals: Normal Fetal Ultrasounds or other Referrals:  None Maternal Substance Abuse:  No Significant Maternal Medications:  Meds include: Other: valtrex Significant Maternal Lab Results: group b strep negative  Results for orders placed or performed during the hospital encounter of 06/13/19 (from the past 24 hour(s))  CBC   Collection Time: 06/13/19  2:52 AM  Result Value Ref Range   WBC 6.8 4.0 - 10.5 K/uL   RBC 3.22 (L) 3.87 - 5.11 MIL/uL   Hemoglobin 11.2 (L) 12.0 - 15.0 g/dL   HCT 32.4 (L) 36.0 - 46.0 %   MCV 100.6 (H) 80.0 - 100.0 fL   MCH 34.8 (H) 26.0 - 34.0 pg   MCHC 34.6 30.0 - 36.0 g/dL   RDW 12.9 11.5 - 15.5 %   Platelets 245 150 - 400 K/uL   nRBC 0.0 0.0 - 0.2 %  Comprehensive metabolic panel   Collection Time: 06/13/19  2:52 AM  Result Value Ref Range   Sodium 136 135 - 145 mmol/L   Potassium 3.1 (L) 3.5 - 5.1 mmol/L   Chloride 104 98 - 111 mmol/L   CO2 22 22 - 32 mmol/L   Glucose, Bld 89 70 - 99 mg/dL   BUN <5 (L) 6 - 20 mg/dL   Creatinine, Ser 0.55 0.44 - 1.00 mg/dL   Calcium 8.7 (L) 8.9 - 10.3 mg/dL   Total Protein 6.0 (L) 6.5  - 8.1 g/dL   Albumin 2.7 (L) 3.5 - 5.0 g/dL   AST 15 15 - 41 U/L   ALT 13 0 - 44 U/L   Alkaline Phosphatase 75 38 - 126 U/L   Total Bilirubin 0.4 0.3 - 1.2 mg/dL   GFR calc non Af Amer >60 >60 mL/min   GFR calc Af Amer >60 >60 mL/min   Anion gap 10 5 - 15  Type and screen [facility]   Collection Time: 06/13/19  3:45 AM  Result Value Ref Range   ABO/RH(D) O POS    Antibody Screen NEG    Sample Expiration      06/16/2019,2359 Performed at [facility], [address] [place], [place] [address]   ABO/Rh   Collection Time: 06/13/19  3:45 AM  Result Value Ref Range   ABO/RH(D)      O POS Performed at [facility], [address] [place], [place] [address]   Urinalysis, Routine w reflex microscopic   Collection Time: 06/13/19  4:40 AM  Result Value Ref Range   Color, Urine STRAW (A) YELLOW   APPearance CLEAR CLEAR   Specific Gravity, Urine 1.003 (L) 1.005 - 1.030   pH 7.0 5.0 - 8.0   Glucose, UA NEGATIVE NEGATIVE mg/dL   Hgb urine dipstick NEGATIVE NEGATIVE   Bilirubin Urine NEGATIVE NEGATIVE   Ketones, ur NEGATIVE NEGATIVE mg/dL   Protein, ur NEGATIVE NEGATIVE mg/dL   Nitrite NEGATIVE NEGATIVE   Leukocytes,Ua NEGATIVE NEGATIVE  Protein / creatinine ratio, urine   Collection Time: 06/13/19  4:40 AM  Result Value Ref Range   Creatinine, Urine 33.96 mg/dL   Total Protein, Urine <6 mg/dL   Protein Creatinine Ratio        0.00 - 0.15 mg/mg[Cre]  SARS Coronavirus 2 [facility](Hospital order, Performed in [facility] lab) Nasopharyngeal Nasopharyngeal Swab   Collection Time: 06/13/19  4:42 AM   Specimen: Nasopharyngeal Swab  Result Value Ref Range   SARS Coronavirus 2 NEGATIVE NEGATIVE    Patient Active Problem List   Diagnosis Date Noted  . Severe preeclampsia, third trimester 06/13/2019  . Pelvic pain affecting pregnancy 04/07/2019  . HSV-2 seropositive 02/19/2019  . Herpes exposure 01/19/2019  . Obesity affecting pregnancy, antepartum  01/14/2019  . Supervision of high risk pregnancy, antepartum 01/12/2019  . Pre-diabetes 09/04/2017  . PCOS (polycystic ovarian syndrome) 08/28/2017  . Obesity (BMI 30-39.9) 08/26/2017  . Transient hypertension 08/26/2017    Assessment/Plan:  Danielle ReedyCourtney Waters is a 28 y.o. G2P0010 at 5960w1d here for IOL for pre-eclampsia with severe features.  Pre-eclampsia -continue to monitor Bps -continue Magnesium sulphate  -Monitor for sx of toxicity  Labor: -Oral cytotec, foley balloon when appropriate. Consider pitocin and AROM if not progressing. -pain control: fentanyl, pt wants epidural in active labor.  Fetal Wellbeing: -GBS (negative) -continuous fetal monitoring   Postpartum Planning -breast and bottle -6/3-/2020 Tdap  -Contraception: would like natural, declined hormonal methods -Circ for baby boy  Towanda OctavePoonam Patel, MD PGY-1, [facility]  07/14/19, [phone]  GME ATTESTATION:  I saw and evaluated the patient. I agree with the findings and the plan of care as documented in the resident's note.  Marlowe AltHailey L Nihal Doan, DO OB Fellow [facility], [facility] 06/13/19, 0800

## 2019-06-14 ENCOUNTER — Encounter (HOSPITAL_COMMUNITY): Payer: Self-pay | Admitting: *Deleted

## 2019-06-14 ENCOUNTER — Inpatient Hospital Stay (HOSPITAL_COMMUNITY): Payer: BC Managed Care – PPO | Admitting: Anesthesiology

## 2019-06-14 DIAGNOSIS — Z3A36 36 weeks gestation of pregnancy: Secondary | ICD-10-CM

## 2019-06-14 DIAGNOSIS — O1414 Severe pre-eclampsia complicating childbirth: Secondary | ICD-10-CM

## 2019-06-14 LAB — CBC
HCT: 34 % — ABNORMAL LOW (ref 36.0–46.0)
HCT: 34.8 % — ABNORMAL LOW (ref 36.0–46.0)
HCT: 33.4 % — ABNORMAL LOW (ref 36.0–46.0)
Hemoglobin: 11.5 g/dL — ABNORMAL LOW (ref 12.0–15.0)
Hemoglobin: 11.8 g/dL — ABNORMAL LOW (ref 12.0–15.0)
Hemoglobin: 11.2 g/dL — ABNORMAL LOW (ref 12.0–15.0)
MCH: 34.6 pg — ABNORMAL HIGH (ref 26.0–34.0)
MCH: 34.7 pg — ABNORMAL HIGH (ref 26.0–34.0)
MCH: 34.6 pg — ABNORMAL HIGH (ref 26.0–34.0)
MCHC: 33.8 g/dL (ref 30.0–36.0)
MCHC: 33.9 g/dL (ref 30.0–36.0)
MCHC: 33.5 g/dL (ref 30.0–36.0)
MCV: 102.4 fL — ABNORMAL HIGH (ref 80.0–100.0)
MCV: 102.4 fL — ABNORMAL HIGH (ref 80.0–100.0)
MCV: 103.1 fL — ABNORMAL HIGH (ref 80.0–100.0)
Platelets: 270 10*3/uL (ref 150–400)
Platelets: 271 10*3/uL (ref 150–400)
Platelets: 275 10*3/uL (ref 150–400)
RBC: 3.32 MIL/uL — ABNORMAL LOW (ref 3.87–5.11)
RBC: 3.4 MIL/uL — ABNORMAL LOW (ref 3.87–5.11)
RBC: 3.24 MIL/uL — ABNORMAL LOW (ref 3.87–5.11)
RDW: 13.3 % (ref 11.5–15.5)
RDW: 13.5 % (ref 11.5–15.5)
RDW: 13.4 % (ref 11.5–15.5)
WBC: 12.2 10*3/uL — ABNORMAL HIGH (ref 4.0–10.5)
WBC: 11.1 10*3/uL — ABNORMAL HIGH (ref 4.0–10.5)
WBC: 13.2 10*3/uL — ABNORMAL HIGH (ref 4.0–10.5)
nRBC: 0 % (ref 0.0–0.2)
nRBC: 0 % (ref 0.0–0.2)
nRBC: 0 % (ref 0.0–0.2)

## 2019-06-14 MED ORDER — FENTANYL CITRATE (PF) 100 MCG/2ML IJ SOLN
INTRAMUSCULAR | Status: AC
  Filled 2019-06-14: qty 2

## 2019-06-14 MED ORDER — DIBUCAINE (PERIANAL) 1 % EX OINT: 1.0000 "application " | TOPICAL_OINTMENT | CUTANEOUS | Status: DC | PRN

## 2019-06-14 MED ORDER — SODIUM CHLORIDE (PF) 0.9 % IJ SOLN
INTRAMUSCULAR | Status: DC | PRN
  Administered 2019-06-14: 12 mL/h via EPIDURAL

## 2019-06-14 MED ORDER — BUPIVACAINE HCL (PF) 0.25 % IJ SOLN
INTRAMUSCULAR | Status: DC | PRN
  Administered 2019-06-14: 8 mL via EPIDURAL

## 2019-06-14 MED ORDER — PHENYLEPHRINE 40 MCG/ML (10ML) SYRINGE FOR IV PUSH (FOR BLOOD PRESSURE SUPPORT): 80.0000 ug | PREFILLED_SYRINGE | INTRAVENOUS | Status: DC | PRN

## 2019-06-14 MED ORDER — IBUPROFEN 600 MG PO TABS
600.0000 mg | ORAL_TABLET | Freq: Four times a day (QID) | ORAL | Status: DC
  Administered 2019-06-14 – 2019-06-16 (×7): 600 mg via ORAL
  Filled 2019-06-14 (×7): qty 1

## 2019-06-14 MED ORDER — LACTATED RINGERS IV SOLN
INTRAVENOUS | Status: DC
  Administered 2019-06-14 – 2019-06-15 (×2): via INTRAVENOUS

## 2019-06-14 MED ORDER — PHENYLEPHRINE 40 MCG/ML (10ML) SYRINGE FOR IV PUSH (FOR BLOOD PRESSURE SUPPORT)
80.0000 ug | PREFILLED_SYRINGE | INTRAVENOUS | Status: DC | PRN
  Filled 2019-06-14: qty 10

## 2019-06-14 MED ORDER — ONDANSETRON HCL 4 MG/2ML IJ SOLN: 4.0000 mg | INTRAMUSCULAR | Status: DC | PRN

## 2019-06-14 MED ORDER — LIDOCAINE HCL (PF) 1 % IJ SOLN
INTRAMUSCULAR | Status: DC | PRN
  Administered 2019-06-14 (×2): 5 mL via EPIDURAL

## 2019-06-14 MED ORDER — SENNOSIDES-DOCUSATE SODIUM 8.6-50 MG PO TABS
2.0000 | ORAL_TABLET | ORAL | Status: DC
  Administered 2019-06-14 – 2019-06-15 (×2): 2 via ORAL
  Filled 2019-06-14 (×2): qty 2

## 2019-06-14 MED ORDER — EPHEDRINE 5 MG/ML INJ: 10.0000 mg | INTRAVENOUS | Status: DC | PRN

## 2019-06-14 MED ORDER — OXYTOCIN 40 UNITS IN NORMAL SALINE INFUSION - SIMPLE MED
1.0000 m[IU]/min | INTRAVENOUS | Status: DC
  Administered 2019-06-14: 05:00:00 2 m[IU]/min via INTRAVENOUS
  Filled 2019-06-14: qty 1000

## 2019-06-14 MED ORDER — ZOLPIDEM TARTRATE 5 MG PO TABS
5.0000 mg | ORAL_TABLET | Freq: Every evening | ORAL | Status: DC | PRN
  Administered 2019-06-14 – 2019-06-16 (×2): 5 mg via ORAL
  Filled 2019-06-14 (×3): qty 1

## 2019-06-14 MED ORDER — COCONUT OIL OIL: 1.0000 "application " | TOPICAL_OIL | Status: DC | PRN

## 2019-06-14 MED ORDER — FENTANYL-BUPIVACAINE-NACL 0.5-0.125-0.9 MG/250ML-% EP SOLN: 12.0000 mL/h | EPIDURAL | Status: DC | PRN

## 2019-06-14 MED ORDER — FENTANYL CITRATE (PF) 100 MCG/2ML IJ SOLN
100.0000 ug | INTRAMUSCULAR | Status: DC | PRN
  Administered 2019-06-14 (×6): 100 ug via INTRAVENOUS
  Filled 2019-06-14 (×5): qty 2

## 2019-06-14 MED ORDER — WITCH HAZEL-GLYCERIN EX PADS: 1.0000 "application " | MEDICATED_PAD | CUTANEOUS | Status: DC | PRN

## 2019-06-14 MED ORDER — ONDANSETRON HCL 4 MG PO TABS: 4.0000 mg | ORAL_TABLET | ORAL | Status: DC | PRN

## 2019-06-14 MED ORDER — MAGNESIUM SULFATE 40 G IN LACTATED RINGERS - SIMPLE: 2.0000 g/h | INTRAVENOUS | Status: AC

## 2019-06-14 MED ORDER — ACETAMINOPHEN 325 MG PO TABS
650.0000 mg | ORAL_TABLET | ORAL | Status: DC | PRN
  Administered 2019-06-15 – 2019-06-16 (×2): 650 mg via ORAL
  Filled 2019-06-14 (×2): qty 2

## 2019-06-14 MED ORDER — DIPHENHYDRAMINE HCL 50 MG/ML IJ SOLN: 12.5000 mg | INTRAMUSCULAR | Status: DC | PRN

## 2019-06-14 MED ORDER — TETANUS-DIPHTH-ACELL PERTUSSIS 5-2.5-18.5 LF-MCG/0.5 IM SUSP: 0.5000 mL | Freq: Once | INTRAMUSCULAR | Status: DC

## 2019-06-14 MED ORDER — FENTANYL-BUPIVACAINE-NACL 0.5-0.125-0.9 MG/250ML-% EP SOLN
12.0000 mL/h | EPIDURAL | Status: DC | PRN
  Filled 2019-06-14: qty 250

## 2019-06-14 MED ORDER — TERBUTALINE SULFATE 1 MG/ML IJ SOLN: 0.2500 mg | Freq: Once | INTRAMUSCULAR | Status: DC | PRN

## 2019-06-14 MED ORDER — PRENATAL MULTIVITAMIN CH
1.0000 | ORAL_TABLET | Freq: Every day | ORAL | Status: DC
  Administered 2019-06-15 – 2019-06-16 (×2): 1 via ORAL
  Filled 2019-06-14 (×2): qty 1

## 2019-06-14 MED ORDER — LACTATED RINGERS IV SOLN
500.0000 mL | Freq: Once | INTRAVENOUS | Status: AC
  Administered 2019-06-14: 13:00:00 500 mL via INTRAVENOUS

## 2019-06-14 MED ORDER — BENZOCAINE-MENTHOL 20-0.5 % EX AERO: 1.0000 "application " | INHALATION_SPRAY | CUTANEOUS | Status: DC | PRN

## 2019-06-14 MED ORDER — SIMETHICONE 80 MG PO CHEW: 80.0000 mg | CHEWABLE_TABLET | ORAL | Status: DC | PRN

## 2019-06-14 MED ORDER — DIPHENHYDRAMINE HCL 25 MG PO CAPS: 25.0000 mg | ORAL_CAPSULE | Freq: Four times a day (QID) | ORAL | Status: DC | PRN

## 2019-06-14 NOTE — Discharge Summary (Signed)
Postpartum Discharge Summary     Patient Name: Danielle Waters DOB: [birth year] MRN: [id number]  Date of admission: 06/13/2019 Delivering Provider: Manya Silvas   Date of discharge: 06/16/2019  Admitting diagnosis: 36 wks HBP headache Intrauterine pregnancy: [redacted]w[redacted]d    Secondary diagnosis:  Active Problems:   Severe preeclampsia, third trimester  Additional problems: None     Discharge diagnosis: Preterm Pregnancy Delivered and Preeclampsia (severe)                                                                                                Post partum procedures:Magnesium x 24 hours  Augmentation: AROM, Pitocin, Cytotec and Foley Balloon  Complications: None  Hospital course:  Induction of Labor With Vaginal Delivery   28y.o. yo G2P0010 at 368w2das admitted to the hospital 06/13/2019 for induction of labor.  Indication for induction: Preeclampsia.  Patient had an uncomplicated labor course as follows: Membrane Rupture Time/Date: 12:06 PM ,06/14/2019   Intrapartum Procedures: Episiotomy: None [1]                                         Lacerations:  1st degree [2]  Patient had delivery of a Viable infant.  Information for the patient's newborn:  Danielle, [name][[id number]]Delivery Method: Vaginal, Spontaneous(Filed from Delivery Summary)  Delivery time: 3:40 PM   06/14/2019  Details of delivery can be found in separate delivery note.  Patient had a postpartum course with Magnesium x 24 hours. Started on Lisinopril for BP control. Patient is discharged home 06/16/19.    Magnesium Sulfate received: Yes BMZ received: Yes Rhophylac:N/A MMR:N/A Transfusion:No  Physical exam  Vitals:   06/15/19 2310 06/16/19 0044 06/16/19 0422 06/16/19 0817  BP: 137/72 (!) 124/55 (!) 146/65 125/61  Pulse: (!) 53 (!) 56 60 72  Resp: _0 Temp: 98.6 F (37 C)  98.3 F (36.8 C) 98.4 F (36.9 C)  TempSrc: Oral  Oral Oral  SpO2: 100%  98% 99%  Weight:      Height:        General: alert, cooperative and no distress Lochia: appropriate Uterine Fundus: firm DVT Evaluation: No evidence of DVT seen on physical exam. Labs: Lab Results  Component Value Date   WBC 10.5 06/15/2019   HGB 9.4 (L) 06/15/2019   HCT 28.3 (L) 06/15/2019   MCV 104.0 (H) 06/15/2019   PLT 261 06/15/2019   CMP Latest Ref Rng & Units 06/13/2019  Glucose 70 - 99 mg/dL 89  BUN 6 - 20 mg/dL <5(L)  Creatinine 0.44 - 1.00 mg/dL 0.55  Sodium 135 - 145 mmol/L 136  Potassium 3.5 - 5.1 mmol/L 3.1(L)  Chloride 98 - 111 mmol/L 104  CO2 22 - 32 mmol/L 22  Calcium 8.9 - 10.3 mg/dL 8.7(L)  Total Protein 6.5 - 8.1 g/dL 6.0(L)  Total Bilirubin 0.3 - 1.2 mg/dL 0.4  Alkaline Phos 38 - 126 U/L 75  AST 15 - 41 U/L 15  ALT 0 -  44 U/L 13    Discharge instruction: per After Visit Summary and "Baby and Me Booklet".  After visit meds:  Allergies as of 06/16/2019   No Known Allergies     Medication List    STOP taking these medications   valACYclovir 500 MG tablet Commonly known as: VALTREX     TAKE these medications   cephALEXin 500 MG capsule Commonly known as: KEFLEX Take 1 capsule (500 mg total) by mouth 3 (three) times daily for 3 days.   ferrous sulfate 325 (65 FE) MG tablet Commonly known as: FerrouSul Take 1 tablet (325 mg total) by mouth daily with breakfast.   ibuprofen 600 MG tablet Commonly known as: ADVIL Take 1 tablet (600 mg total) by mouth every 6 (six) hours.   lisinopril 10 MG tablet Commonly known as: ZESTRIL Take 1 tablet (10 mg total) by mouth daily.   multivitamin-prenatal 27-0.8 MG Tabs tablet Take 1 tablet by mouth daily at 12 noon.       Diet: routine diet  Activity: Advance as tolerated. Pelvic rest for 6 weeks.   Outpatient follow up:4 weeks Follow up Appt: Future Appointments  Date Time Provider [place]  06/18/2019 10:00 AM [facility] NURSE [facility]  07/14/2019  2:00 PM Donnamae Jude, MD [facility]   Follow up  Visit: [place] [facility] [place] at [facility]. Call in 4 week(s).   Specialty: Obstetrics and Gynecology Why: for postpartum check Contact information: [place] [phone]           Please schedule this patient for Postpartum visit in: 4 weeks with the following provider: Any provider For C/S patients schedule nurse incision check in weeks 2 weeks: NA High risk pregnancy complicated by: HTN Delivery mode:  SVD Anticipated Birth Control:  LAM PP Procedures needed: BP check ~9/3 Schedule Integrated BH visit: no      Newborn Data: Live born female  Birth Weight: 5 lb 11.7 oz (2600 g) APGAR: 5, 9  Newborn Delivery   Birth date/time: 06/14/2019 15:40:00 Delivery type: Vaginal, Spontaneous      Baby Feeding: Breast Disposition:NICU   06/16/2019 Donnamae Jude, MD

## 2019-06-14 NOTE — Progress Notes (Signed)
Pt redosed per anesthesia 

## 2019-06-14 NOTE — Progress Notes (Signed)
Danielle Waters is a 28 y.o. G2P0010 at [redacted]w[redacted]d.  Subjective: Mild-mod discomfort  Objective: BP (!) 141/76   Pulse 65   Temp 97.7 F (36.5 C) (Axillary)   Resp 20   Ht 5\' 3"  (1.6 m)   Wt 94.3 kg   BMI 36.85 kg/m    FHT:  FHR: 110 bpm, variability: mod,  accelerations:  15x15,  decelerations:  none UC:   Q 2-3 minutes, moderate Dilation: 5 Effacement (%): Thick Cervical Position: Posterior Station: -2 Presentation: Vertex Exam by:: V Brytney Somes CNM AROM large amount of clear fluid  Labs: Results for orders placed or performed during the hospital encounter of 06/13/19 (from the past 24 hour(s))  CBC     Status: Abnormal   Collection Time: 06/14/19  7:35 AM  Result Value Ref Range   WBC 12.2 (H) 4.0 - 10.5 K/uL   RBC 3.32 (L) 3.87 - 5.11 MIL/uL   Hemoglobin 11.5 (L) 12.0 - 15.0 g/dL   HCT 34.0 (L) 36.0 - 46.0 %   MCV 102.4 (H) 80.0 - 100.0 fL   MCH 34.6 (H) 26.0 - 34.0 pg   MCHC 33.8 30.0 - 36.0 g/dL   RDW 13.3 11.5 - 15.5 %   Platelets 270 150 - 400 K/uL   nRBC 0.0 0.0 - 0.2 %  CBC     Status: Abnormal   Collection Time: 06/14/19 12:32 PM  Result Value Ref Range   WBC 11.1 (H) 4.0 - 10.5 K/uL   RBC 3.40 (L) 3.87 - 5.11 MIL/uL   Hemoglobin 11.8 (L) 12.0 - 15.0 g/dL   HCT 34.8 (L) 36.0 - 46.0 %   MCV 102.4 (H) 80.0 - 100.0 fL   MCH 34.7 (H) 26.0 - 34.0 pg   MCHC 33.9 30.0 - 36.0 g/dL   RDW 13.5 11.5 - 15.5 %   Platelets 271 150 - 400 K/uL   nRBC 0.0 0.0 - 0.2 %    Assessment / Plan: [redacted]w[redacted]d week IUP Labor: Early/IOL  Fetal Wellbeing:  Category I Pain Control:  Considering epidural Anticipated MOD:  SVD  Manya Silvas, [place] 06/14/2019 1:39 PM

## 2019-06-14 NOTE — Progress Notes (Addendum)
Danielle Waters a 28 y.o.G2P0010 at [redacted]w[redacted]d presented for IOL for pre-eclampsia.  Subjective: Pt comfortable, not feeling contractions. Still no progress since previous cervical exam.  Discussed option of foley catheter insertion VS waiting and giving more cytotec. Explained to pt benefits of putting in catheter and how we would insert it. Pt happy to proceed with catheter. Also offered Fentanyl to help ease pain which pt accepted. Given 100mg  Fentanyl. Danielle Waters (Midwife) inserted foley with 60cc of normal saline. Tolerated well. Pt felt some abdominal cramps after insertion. Denies headache, RUQ pain or LE edema.  Objective: BP 132/61   Pulse 79   Temp 98.6 F (37 C) (Oral)   Resp 17   Ht 5\' 3"  (1.6 m)   Wt 94.3 kg   BMI 36.85 kg/m  I/O last 3 completed shifts: In: 4420.1 [P.O.:2170; I.V.:1721; IV Piggyback:529.2] Out: 5550 [Urine:5550] Total I/O In: 2344.5 [P.O.:1450; I.V.:894.5] Out: 2050 [Urine:2050]  FHT:  FHR: 110 bpm, variability: moderate,  accelerations:  Present,  decelerations:  Absent UC:   regular, every 3-5 minutes SVE:   Dilation: 1 Effacement (%): 50 Station: -2 Exam by:: Danielle Waters CNM  Labs: Lab Results  Component Value Date   WBC 6.8 06/13/2019   HGB 11.2 (L) 06/13/2019   HCT 32.4 (L) 06/13/2019   MCV 100.6 (H) 06/13/2019   PLT 245 06/13/2019    Assessment / Plan: 29 y.C.[id number] [redacted]w[redacted]d presenting for IOL for pre-eclampsia  Labor: No cervical change since last cervical check at 9pm. S/p cytotec X 5. Inserted foley catheter at 1:00am with 60cc of normal saline with IV fentanyl by Danielle Waters. Tolerated well. Give additional dose of cytotec. Hourly checks on foley catheter. Consider vaginal cytotec/pitocin/AROM. Preeclampsia:  on magnesium sulfate and no signs or symptoms of toxicity Bp 132/61. Continue to monitor for severe range pressures.  Fetal Wellbeing:  Category I fetal tracing reassuring  Pain Control:  IV pain meds, would like  epidural in active stage of labor. I/D:  n/a Anticipated MOD:  NSVD anticipated, progress to c-section if maternal/fetal indication. Anxiety: ambien PRN  Danielle Waters 06/14/2019, 1:07 AM

## 2019-06-14 NOTE — Progress Notes (Addendum)
Danielle Waters a 28 y.o.G2P0010 at [redacted]w[redacted]d presented for IOL for pre-eclampsia.  Subjective: Feeling mild contractions. Foley catheter fell out, explained to pt that we will start Pitocin now. Agreed and is happy with plan.   Objective: BP 138/71   Pulse 68   Temp 98.6 F (37 C) (Axillary)   Resp 17   Ht 5\' 3"  (1.6 m)   Wt 94.3 kg   BMI 36.85 kg/m  I/O last 3 completed shifts: In: 4420.1 [P.O.:2170; I.V.:1721; IV Piggyback:529.2] Out: 5550 [Urine:5550] Total I/O In: 3236.3 [P.O.:1900; I.V.:1336.3] Out: 2450 [Urine:2450]  FHT:  FHR: 110 bpm, variability: moderate,  accelerations:  Present,  decelerations:  Absent UC:   regular, every 1-5 minutes SVE:   Dilation: 3.5 Effacement (%): 50 Station: -2 Exam by:: Dr. Posey Pronto  Labs: Lab Results  Component Value Date   WBC 6.8 06/13/2019   HGB 11.2 (L) 06/13/2019   HCT 32.4 (L) 06/13/2019   MCV 100.6 (H) 06/13/2019   PLT 245 06/13/2019    Assessment / Plan: 89 y.C.B7S2831 [redacted]w[redacted]d presenting for IOL for pre-eclampsia  Labor: Progressing normally s/p cytotec X5. Foley catheter fell out recently. Cervical check: 3.5, 50, -2. Start low dose pitocin at 52mu and increase by 31mu every 30 mins until adequate contraction pattern. Recheck cervix in 4 hrs. Preeclampsia:  on magnesium sulfate and no signs or symptoms of toxicity Bp 138/71. Continue to monitor for severe range pressures.  Fetal Wellbeing:  Category I fetal tracing reassuring  Pain Control:  IV pain meds, would like epidural in active stage of labor. I/D:  n/a Anticipated MOD:  NSVD anticipated, progress to c-section if maternal/fetal indication. Anxiety: Lorrin Mais PRN  Lattie Haw MD PGY-1, [facility] 06/14/2019, 4:48 AM

## 2019-06-14 NOTE — Progress Notes (Signed)
Labor Progress Note Danielle Waters is a 28 y.o. G2P0010 at [redacted]w[redacted]d presented for IOL for pre-eclampsia with severe features.  S: Contractions 5/10 now and well controlled with IV pain medication. Denies headache, vision changes and RUQ pain.  O:  BP 129/74   Pulse 70   Temp 98.4 F (36.9 C) (Oral)   Resp 18   Ht 5\' 3"  (1.6 m)   Wt 94.3 kg   BMI 36.85 kg/m  EFM: 105-110/moderate var/accels, no decels in las 3 hours  CVE: Dilation: 4 Effacement (%): 50 Cervical Position: Posterior Station: -2 Presentation: Vertex Exam by:: Dr Pilar Plate   A&P: 28 y.o. G2P0010 [redacted]w[redacted]d IOL for pre-eclampsia with severe features (BP).  #Labor: Progressing on cytotec x 5 and s/p FB.  Now on Pit. Continue Pit titrate up as appropriate.  #Pain: IV pain medication for now.  Epidural upon request. #FWB: Category II (Borderline heart rate) #GBS negative #Pre-eclampsia: BP non-severe range at this time. Labetalol PRN ordered. Mag running. #pre-term labor: s/p betamethasone x 2 (last at 0921 8/30)  Matilde Haymaker, MD 10:21 AM

## 2019-06-14 NOTE — Consult Note (Deleted)
Delivery Note    Requested by CNM to attend this vaginal delivery at 36 2/7 weeks due to prematurity.   Born to a G2P0 mother with pregnancy complicated by pre-eclampsia with severe features, HSV positive, pre-diabetic, and obesity.  Rupture of membranes occurred about 4 hours prior to delivery with clear fluid.   Team arrived after baby was delivered and initially not needed. Team was called back to delivery room at about 11 minutes of life due to hypotonia and desaturations.  Infant was receiving blow-by oxygen with 90% FiO2. FiO2 was gradually weaned to 21% and then removed at 14 minutes of life. Infant maintained oxygen saturations of 90-95% until 16 minutes of life, unlabored work of breathing.  Physical exam within normal limits.   Left in L&D for skin-to-skin contact with mother, in care of CN staff.  Care transferred to Pediatrician.  Mayford Knife, NNP-BC

## 2019-06-14 NOTE — Anesthesia Preprocedure Evaluation (Signed)
Anesthesia Evaluation  Patient identified by MRN, date of birth, ID band Patient awake    Reviewed: Allergy & Precautions, H&P , NPO status , Patient's Chart, lab work & pertinent test results  History of Anesthesia Complications Negative for: history of anesthetic complications  Airway Mallampati: II  TM Distance: >3 FB Neck ROM: full    Dental no notable dental hx. (+) Teeth Intact   Pulmonary neg pulmonary ROS,    Pulmonary exam normal breath sounds clear to auscultation       Cardiovascular negative cardio ROS Normal cardiovascular exam Rhythm:regular Rate:Normal     Neuro/Psych negative neurological ROS  negative psych ROS   GI/Hepatic negative GI ROS, Neg liver ROS,   Endo/Other  negative endocrine ROS  Renal/GU negative Renal ROS  negative genitourinary   Musculoskeletal   Abdominal   Peds  Hematology negative hematology ROS (+)   Anesthesia Other Findings   Reproductive/Obstetrics (+) Pregnancy (Severe Pre-E)                             Anesthesia Physical Anesthesia Plan  ASA: III  Anesthesia Plan: Epidural   Post-op Pain Management:    Induction:   PONV Risk Score and Plan:   Airway Management Planned:   Additional Equipment:   Intra-op Plan:   Post-operative Plan:   Informed Consent: I have reviewed the patients History and Physical, chart, labs and discussed the procedure including the risks, benefits and alternatives for the proposed anesthesia with the patient or authorized representative who has indicated his/her understanding and acceptance.       Plan Discussed with:   Anesthesia Plan Comments:         Anesthesia Quick Evaluation

## 2019-06-14 NOTE — Anesthesia Procedure Notes (Signed)
Epidural Patient location during procedure: OB  Staffing Anesthesiologist: Harland Aguiniga, MD Performed: anesthesiologist   Preanesthetic Checklist Completed: patient identified, site marked, surgical consent, pre-op evaluation, timeout performed, IV checked, risks and benefits discussed and monitors and equipment checked  Epidural Patient position: sitting Prep: DuraPrep Patient monitoring: heart rate, continuous pulse ox and blood pressure Approach: right paramedian Location: L3-L4 Injection technique: LOR saline  Needle:  Needle type: Tuohy  Needle gauge: 17 G Needle length: 9 cm and 9 Needle insertion depth: 6 cm Catheter type: closed end flexible Catheter size: 20 Guage Catheter at skin depth: 10 cm Test dose: negative  Assessment Events: blood not aspirated, injection not painful, no injection resistance, negative IV test and no paresthesia  Additional Notes Patient identified. Risks/Benefits/Options discussed with patient including but not limited to bleeding, infection, nerve damage, paralysis, failed block, incomplete pain control, headache, blood pressure changes, nausea, vomiting, reactions to medication both or allergic, itching and postpartum back pain. Confirmed with bedside nurse the patient's most recent platelet count. Confirmed with patient that they are not currently taking any anticoagulation, have any bleeding history or any family history of bleeding disorders. Patient expressed understanding and wished to proceed. All questions were answered. Sterile technique was used throughout the entire procedure. Please see nursing notes for vital signs. Test dose was given through epidural needle and negative prior to continuing to dose epidural or start infusion. Warning signs of high block given to the patient including shortness of breath, tingling/numbness in hands, complete motor block, or any concerning symptoms with instructions to call for help. Patient was given  instructions on fall risk and not to get out of bed. All questions and concerns addressed with instructions to call with any issues.     

## 2019-06-15 LAB — CERVICOVAGINAL ANCILLARY ONLY
Bacterial vaginitis: POSITIVE — AB
Candida vaginitis: NEGATIVE
Chlamydia: NEGATIVE
Neisseria Gonorrhea: NEGATIVE
Trichomonas: NEGATIVE

## 2019-06-15 LAB — CBC
HCT: 28.3 % — ABNORMAL LOW (ref 36.0–46.0)
Hemoglobin: 9.4 g/dL — ABNORMAL LOW (ref 12.0–15.0)
MCH: 34.6 pg — ABNORMAL HIGH (ref 26.0–34.0)
MCHC: 33.2 g/dL (ref 30.0–36.0)
MCV: 104 fL — ABNORMAL HIGH (ref 80.0–100.0)
Platelets: 261 10*3/uL (ref 150–400)
RBC: 2.72 MIL/uL — ABNORMAL LOW (ref 3.87–5.11)
RDW: 13.6 % (ref 11.5–15.5)
WBC: 10.5 10*3/uL (ref 4.0–10.5)
nRBC: 0 % (ref 0.0–0.2)

## 2019-06-15 LAB — URINALYSIS, ROUTINE W REFLEX MICROSCOPIC
Bilirubin Urine: NEGATIVE
Glucose, UA: NEGATIVE mg/dL
Ketones, ur: NEGATIVE mg/dL
Nitrite: NEGATIVE
Protein, ur: NEGATIVE mg/dL
Specific Gravity, Urine: 1.01 (ref 1.005–1.030)
pH: 6 (ref 5.0–8.0)

## 2019-06-15 MED ORDER — LISINOPRIL 10 MG PO TABS
10.0000 mg | ORAL_TABLET | Freq: Every day | ORAL | Status: DC
  Administered 2019-06-15 – 2019-06-16 (×2): 10 mg via ORAL
  Filled 2019-06-15 (×2): qty 1

## 2019-06-15 MED ORDER — CEPHALEXIN 500 MG PO CAPS
500.0000 mg | ORAL_CAPSULE | Freq: Four times a day (QID) | ORAL | Status: DC
  Administered 2019-06-15 – 2019-06-16 (×4): 500 mg via ORAL
  Filled 2019-06-15 (×4): qty 1

## 2019-06-15 MED ORDER — LISINOPRIL 10 MG PO TABS: 10.0000 mg | ORAL_TABLET | Freq: Every day | ORAL | 1 refills | Status: DC

## 2019-06-15 MED FILL — LISINOPRIL 10 MG TABS: 10 | 30 days supply | Qty: 30 | Fill #0

## 2019-06-15 NOTE — Anesthesia Postprocedure Evaluation (Signed)
Anesthesia Post Note  Patient: Tyianna Remus  Procedure(s) Performed: AN AD Shindler     Patient location during evaluation: Mother Baby Anesthesia Type: Epidural Level of consciousness: awake and alert Pain management: pain level controlled Vital Signs Assessment: post-procedure vital signs reviewed and stable Respiratory status: spontaneous breathing, nonlabored ventilation and respiratory function stable Cardiovascular status: stable Postop Assessment: no headache, no backache and epidural receding Anesthetic complications: no    Last Vitals:  Vitals:   06/15/19 0351 06/15/19 0753  BP: 119/67 130/73  Pulse: 76 64  Resp: 16 16  Temp: 36.9 C 36.5 C  SpO2: 98% 98%    Last Pain:  Vitals:   06/15/19 0800  TempSrc:   PainSc: 0-No pain   Pain Goal: Patients Stated Pain Goal: 3 (06/15/19 0800)              Epidural/Spinal Function Cutaneous sensation: Normal sensation (06/15/19 0800), Patient able to flex knees: Yes (06/15/19 0800), Patient able to lift hips off bed: Yes (06/15/19 0800), Back pain beyond tenderness at insertion site: No (06/15/19 0800), Progressively worsening motor and/or sensory loss: No (06/15/19 0800), Bowel and/or bladder incontinence post epidural: No (06/15/19 0800)  Drucie Opitz

## 2019-06-15 NOTE — Lactation Note (Signed)
This note was copied from a baby's chart. Lactation Consultation Note  Patient Name: Danielle Waters AYTKZ'S Date: 06/15/2019 Reason for consult: Initial assessment;Primapara;1st time breastfeeding;Late-preterm 34-36.6wks;Infant < 6lbs;Other (Comment)(mom on [id number]) Type of Endocrine Disorder?: PCOS(per mom was dx after miscarriage last year and was seeing a fertility specialist)  Baby is 47 hours old.  DEBP was set up at bedside , per mom has not pumped due to not feeling well since yesterday, better today.  Per mom mentioned she had several breast changes - size increased , areola more swollen , darker and larger, and soreness.  Mom has a hx of PCOS / dx last year.  LC reviewed breast massage, hand express, ( 1-2 drops EBM yield ).,  And pumped both breast with #24 F ( comfortable and good fit ).  Mom still pumping and comfortable .  LC reviewed supply and demand / importance of consistent hand expressing and pumping around the clock both breast  15 -20 mims.  LC reviewed both phases on the DEBP .  Per mom  has DEBP Medela at home.  LC provided the hand outs for NICU booklet.    Maternal Data Has patient been taught Hand Expression?: Yes(1-2 drops on the right) Does the patient have breastfeeding experience prior to this delivery?: No  Feeding    LATCH Score                   Interventions Interventions: Breast feeding basics reviewed;Expressed milk;DEBP  Lactation Tools Discussed/Used Tools: Pump;Flanges Flange Size: 24(LC checked sizing and a good fit . per mom comfortable) Breast pump type: Double-Electric Breast Pump WIC Program: No Pump Review: Setup, frequency, and cleaning;Milk Storage Initiated by:: MAI Date initiated:: 06/15/19   Consult Status Consult Status: Follow-up Date: 06/16/19 Follow-up type: In-patient    Hale 06/15/2019, 10:39 AM

## 2019-06-15 NOTE — Progress Notes (Signed)
Patient screened out for psychosocial assessment since none of the following apply:  Psychosocial stressors documented in mother or baby's chart  Gestation less than 32 weeks  Code at delivery   Infant with anomalies Please contact the Clinical Social Worker if specific needs arise, by MOB's request, or if MOB scores greater than 9/yes to question 10 on [place] Postpartum Depression Screen.  Jilian West, LCSW Clinical Social Worker [facility] Cell#: [phone]     

## 2019-06-15 NOTE — Progress Notes (Signed)
Post Partum Day 1 Subjective: no complaints, up ad lib, voiding, tolerating PO and on Magnesium  Objective: Vitals:   06/14/19 2344 06/15/19 0351 06/15/19 0753 06/15/19 0900  BP: 130/63 119/67 130/73   Pulse: 67 76 64   Resp: 18 16 16 16   Temp: 98.6 F (37 C) 98.5 F (36.9 C) 97.7 F (36.5 C)   TempSrc: Oral Oral Oral   SpO2: 100% 98% 98%   Weight:      Height:        Blood pressure 130/73, pulse 64, temperature 97.7 F (36.5 C), temperature source Oral, resp. rate 16, height 5\' 3"  (1.6 m), weight 94.3 kg, SpO2 98 %, unknown if currently breastfeeding.  Intake/Output Summary (Last 24 hours) at 06/15/2019 2800 Last data filed at 06/15/2019 0900 Gross per 24 hour  Intake 7158.51 ml  Output 5053 ml  Net 2105.51 ml    Physical Exam:  General: alert, cooperative and appears stated age 28: appropriate Uterine Fundus: firm DVT Evaluation: No evidence of DVT seen on physical exam.  Recent Labs    06/14/19 1837 06/15/19 0623  HGB 11.2* 9.4*  HCT 33.4* 28.3*    Assessment/Plan: Plan for discharge tomorrow, Breastfeeding and Contraception Natural Family planning  Magnesium x 24 hour pp Begin Lisinopril in anticipation of needing meds once Magnesium is off given Blood pressures in the 130/80 range.   LOS: 2 days   Donnamae Jude 06/15/2019, 9:38 AM

## 2019-06-15 NOTE — Plan of Care (Signed)
  Problem: Clinical Measurements: Goal: Ability to maintain clinical measurements within normal limits will improve Outcome: Progressing Monitoring BP s/p iv magnesium Goal: Will remain free from infection Outcome: Progressing  Pt started on Keflex for UTI

## 2019-06-16 MED ORDER — CEPHALEXIN 500 MG PO CAPS: 500.0000 mg | ORAL_CAPSULE | Freq: Three times a day (TID) | ORAL | 0 refills | Status: DC

## 2019-06-16 MED ORDER — IBUPROFEN 600 MG PO TABS: 600.0000 mg | ORAL_TABLET | Freq: Four times a day (QID) | ORAL | 0 refills | Status: AC

## 2019-06-16 NOTE — Discharge Instructions (Signed)
Postpartum Care After Vaginal Delivery °This sheet gives you information about how to care for yourself from the time you deliver your baby to up to 6-12 weeks after delivery (postpartum period). Your health care provider may also give you more specific instructions. If you have problems or questions, contact your health care provider. °Follow these instructions at home: °Vaginal bleeding °· It is normal to have vaginal bleeding (lochia) after delivery. Wear a sanitary pad for vaginal bleeding and discharge. °? During the first week after delivery, the amount and appearance of lochia is often similar to a menstrual period. °? Over the next few weeks, it will gradually decrease to a dry, yellow-brown discharge. °? For most women, lochia stops completely by 4-6 weeks after delivery. Vaginal bleeding can vary from woman to woman. °· Change your sanitary pads frequently. Watch for any changes in your flow, such as: °? A sudden increase in volume. °? A change in color. °? Large blood clots. °· If you pass a blood clot from your vagina, save it and call your health care provider to discuss. Do not flush blood clots down the toilet before talking with your health care provider. °· Do not use tampons or douches until your health care provider says this is safe. °· If you are not breastfeeding, your period should return 6-8 weeks after delivery. If you are feeding your child breast milk only (exclusive breastfeeding), your period may not return until you stop breastfeeding. °Perineal care °· Keep the area between the vagina and the anus (perineum) clean and dry as told by your health care provider. Use medicated pads and pain-relieving sprays and creams as directed. °· If you had a cut in the perineum (episiotomy) or a tear in the vagina, check the area for signs of infection until you are healed. Check for: °? More redness, swelling, or pain. °? Fluid or blood coming from the cut or tear. °? Warmth. °? Pus or a bad  smell. °· You may be given a squirt bottle to use instead of wiping to clean the perineum area after you go to the bathroom. As you start healing, you may use the squirt bottle before wiping yourself. Make sure to wipe gently. °· To relieve pain caused by an episiotomy, a tear in the vagina, or swollen veins in the anus (hemorrhoids), try taking a warm sitz bath 2-3 times a day. A sitz bath is a warm water bath that is taken while you are sitting down. The water should only come up to your hips and should cover your buttocks. °Breast care °· Within the first few days after delivery, your breasts may feel heavy, full, and uncomfortable (breast engorgement). Milk may also leak from your breasts. Your health care provider can suggest ways to help relieve the discomfort. Breast engorgement should go away within a few days. °· If you are breastfeeding: °? Wear a bra that supports your breasts and fits you well. °? Keep your nipples clean and dry. Apply creams and ointments as told by your health care provider. °? You may need to use breast pads to absorb milk that leaks from your breasts. °? You may have uterine contractions every time you breastfeed for up to several weeks after delivery. Uterine contractions help your uterus return to its normal size. °? If you have any problems with breastfeeding, work with your health care provider or lactation consultant. °· If you are not breastfeeding: °? Avoid touching your breasts a lot. Doing this can make   your breasts produce more milk. °? Wear a good-fitting bra and use cold packs to help with swelling. °? Do not squeeze out (express) milk. This causes you to make more milk. °Intimacy and sexuality °· Ask your health care provider when you can engage in sexual activity. This may depend on: °? Your risk of infection. °? How fast you are healing. °? Your comfort and desire to engage in sexual activity. °· You are able to get pregnant after delivery, even if you have not had  your period. If desired, talk with your health care provider about methods of birth control (contraception). °Medicines °· Take over-the-counter and prescription medicines only as told by your health care provider. °· If you were prescribed an antibiotic medicine, take it as told by your health care provider. Do not stop taking the antibiotic even if you start to feel better. °Activity °· Gradually return to your normal activities as told by your health care provider. Ask your health care provider what activities are safe for you. °· Rest as much as possible. Try to rest or take a nap while your baby is sleeping. °Eating and drinking ° °· Drink enough fluid to keep your urine pale yellow. °· Eat high-fiber foods every day. These may help prevent or relieve constipation. High-fiber foods include: °? Whole grain cereals and breads. °? Brown rice. °? Beans. °? Fresh fruits and vegetables. °· Do not try to lose weight quickly by cutting back on calories. °· Take your prenatal vitamins until your postpartum checkup or until your health care provider tells you it is okay to stop. °Lifestyle °· Do not use any products that contain nicotine or tobacco, such as cigarettes and e-cigarettes. If you need help quitting, ask your health care provider. °· Do not drink alcohol, especially if you are breastfeeding. °General instructions °· Keep all follow-up visits for you and your baby as told by your health care provider. Most women visit their health care provider for a postpartum checkup within the first 3-6 weeks after delivery. °Contact a health care provider if: °· You feel unable to cope with the changes that your child brings to your life, and these feelings do not go away. °· You feel unusually sad or worried. °· Your breasts become red, painful, or hard. °· You have a fever. °· You have trouble holding urine or keeping urine from leaking. °· You have little or no interest in activities you used to enjoy. °· You have not  breastfed at all and you have not had a menstrual period for 12 weeks after delivery. °· You have stopped breastfeeding and you have not had a menstrual period for 12 weeks after you stopped breastfeeding. °· You have questions about caring for yourself or your baby. °· You pass a blood clot from your vagina. °Get help right away if: °· You have chest pain. °· You have difficulty breathing. °· You have sudden, severe leg pain. °· You have severe pain or cramping in your lower abdomen. °· You bleed from your vagina so much that you fill more than one sanitary pad in one hour. Bleeding should not be heavier than your heaviest period. °· You develop a severe headache. °· You faint. °· You have blurred vision or spots in your vision. °· You have bad-smelling vaginal discharge. °· You have thoughts about hurting yourself or your baby. °If you ever feel like you may hurt yourself or others, or have thoughts about taking your own life, get help   right away. You can go to the nearest emergency department or call: °· Your local emergency services (911 in the [place] °· A suicide crisis helpline, such as the National Suicide Prevention Lifeline at 1-800-273-8255. This is open 24 hours a day. °Summary °· The period of time right after you deliver your newborn up to 6-12 weeks after delivery is called the postpartum period. °· Gradually return to your normal activities as told by your health care provider. °· Keep all follow-up visits for you and your baby as told by your health care provider. °This information is not intended to replace advice given to you by your health care provider. Make sure you discuss any questions you have with your health care provider. °Document Released: 07/29/2007 Document Revised: 10/04/2017 Document Reviewed: 07/15/2017 °Elsevier Patient Education © 2020 [employer] ° ° ° °Postpartum Hypertension °Postpartum hypertension is high blood pressure that remains higher than normal after childbirth. You  may not realize that you have postpartum hypertension if your blood pressure is not being checked regularly. In most cases, postpartum hypertension will go away on its own, usually within a week of delivery. However, for some women, medical treatment is required to prevent serious complications, such as seizures or stroke. °What are the causes? °This condition may be caused by one or more of the following: °· Hypertension that existed before pregnancy (chronic hypertension). °· Hypertension that comes on as a result of pregnancy (gestational hypertension). °· Hypertensive disorders during pregnancy (preeclampsia) or seizures in women who have high blood pressure during pregnancy (eclampsia). °· A condition in which the liver, platelets, and red blood cells are damaged during pregnancy (HELLP syndrome). °· A condition in which the thyroid produces too much hormones (hyperthyroidism). °· Other rare problems of the nerves (neurological disorders) or blood disorders. °In some cases, the cause may not be known. °What increases the risk? °The following factors may make you more likely to develop this condition: °· Chronic hypertension. In some cases, this may not have been diagnosed before pregnancy. °· Obesity. °· Type 2 diabetes. °· Kidney disease. °· History of preeclampsia or eclampsia. °· Other medical conditions that change the level of hormones in the body (hormonal imbalance). °What are the signs or symptoms? °As with all types of hypertension, postpartum hypertension may not have any symptoms. Depending on how high your blood pressure is, you may experience: °· Headaches. These may be mild, moderate, or severe. They may also be steady, constant, or sudden in onset (thunderclap headache). °· Changes in your ability to see (visual changes). °· Dizziness. °· Shortness of breath. °· Swelling of your hands, feet, lower legs, or face. In some cases, you may have swelling in more than one of these locations. °· Heart  palpitations or a racing heartbeat. °· Difficulty breathing while lying down. °· Decrease in the amount of urine that you pass. °Other rare signs and symptoms may include: °· Sweating more than usual. This lasts longer than a few days after delivery. °· Chest pain. °· Sudden dizziness when you get up from sitting or lying down. °· Seizures. °· Nausea or vomiting. °· Abdominal pain. °How is this diagnosed? °This condition may be diagnosed based on the results of a physical exam, blood pressure measurements, and blood and urine tests. °You may also have other tests, such as a CT scan or an MRI, to check for other problems of postpartum hypertension. °How is this treated? °If blood pressure is high enough to require treatment, your options may include: °·   Medicines to reduce blood pressure (antihypertensives). Tell your health care provider if you are breastfeeding or if you plan to breastfeed. There are many antihypertensive medicines that are safe to take while breastfeeding. °· Stopping medicines that may be causing hypertension. °· Treating medical conditions that are causing hypertension. °· Treating the complications of hypertension, such as seizures, stroke, or kidney problems. °Your health care provider will also continue to monitor your blood pressure closely until it is within a safe range for you. °Follow these instructions at home: °· Take over-the-counter and prescription medicines only as told by your health care provider. °· Return to your normal activities as told by your health care provider. Ask your health care provider what activities are safe for you. °· Do not use any products that contain nicotine or tobacco, such as cigarettes and e-cigarettes. If you need help quitting, ask your health care provider. °· Keep all follow-up visits as told by your health care provider. This is important. °Contact a health care provider if: °· Your symptoms get worse. °· You have new symptoms, such as: °? A  headache that does not get better. °? Dizziness. °? Visual changes. °Get help right away if: °· You suddenly develop swelling in your hands, ankles, or face. °· You have sudden, rapid weight gain. °· You develop difficulty breathing, chest pain, racing heartbeat, or heart palpitations. °· You develop severe pain in your abdomen. °· You have any symptoms of a stroke. "BE FAST" is an easy way to remember the main warning signs of a stroke: °? B - Balance. Signs are dizziness, sudden trouble walking, or loss of balance. °? E - Eyes. Signs are trouble seeing or a sudden change in vision. °? F - Face. Signs are sudden weakness or numbness of the face, or the face or eyelid drooping on one side. °? A - Arms. Signs are weakness or numbness in an arm. This happens suddenly and usually on one side of the body. °? S - Speech. Signs are sudden trouble speaking, slurred speech, or trouble understanding what people say. °? T - Time. Time to call emergency services. Write down what time symptoms started. °· You have other signs of a stroke, such as: °? A sudden, severe headache with no known cause. °? Nausea or vomiting. °? Seizure. °These symptoms may represent a serious problem that is an emergency. Do not wait to see if the symptoms will go away. Get medical help right away. Call your local emergency services (911 in the [place] Do not drive yourself to the hospital. °Summary °· Postpartum hypertension is high blood pressure that remains higher than normal after childbirth. °· In most cases, postpartum hypertension will go away on its own, usually within a week of delivery. °· For some women, medical treatment is required to prevent serious complications, such as seizures or stroke. °This information is not intended to replace advice given to you by your health care provider. Make sure you discuss any questions you have with your health care provider. °Document Released: 06/04/2014 Document Revised: 11/07/2018 Document  Reviewed: 07/22/2017 °Elsevier Patient Education © 2020 [employer] ° °

## 2019-06-16 NOTE — Progress Notes (Signed)
Pt d/c home accompanied by FOB. Pt educated on s/s of Pre-E, s/s of infection, follow-up appointment, medication regimen, and post-partum care. Pt verbalized understanding, and assured me that breast pump equipment/tubing was packed.

## 2019-06-17 ENCOUNTER — Inpatient Hospital Stay (HOSPITAL_COMMUNITY): Payer: BC Managed Care – PPO

## 2019-06-17 ENCOUNTER — Other Ambulatory Visit: Payer: Self-pay

## 2019-06-17 ENCOUNTER — Telehealth: Payer: Self-pay | Admitting: *Deleted

## 2019-06-17 ENCOUNTER — Encounter (HOSPITAL_COMMUNITY): Payer: Self-pay

## 2019-06-17 ENCOUNTER — Inpatient Hospital Stay (HOSPITAL_COMMUNITY)
Admission: AD | Admit: 2019-06-17 | Discharge: 2019-06-19 | Disposition: A | Discharge status: Home / Self Care | Payer: BC Managed Care – PPO | Source: Home / Self Care | Attending: Family Medicine | Admitting: Family Medicine

## 2019-06-17 DIAGNOSIS — O1415 Severe pre-eclampsia, complicating the puerperium: Secondary | ICD-10-CM

## 2019-06-17 DIAGNOSIS — O99013 Anemia complicating pregnancy, third trimester: Secondary | ICD-10-CM

## 2019-06-17 DIAGNOSIS — J811 Chronic pulmonary edema: Secondary | ICD-10-CM | POA: Diagnosis present

## 2019-06-17 DIAGNOSIS — O1205 Gestational edema, complicating the puerperium: Secondary | ICD-10-CM

## 2019-06-17 DIAGNOSIS — Z8759 Personal history of other complications of pregnancy, childbirth and the puerperium: Secondary | ICD-10-CM | POA: Diagnosis present

## 2019-06-17 DIAGNOSIS — R0602 Shortness of breath: Secondary | ICD-10-CM

## 2019-06-17 HISTORY — DX: Gestational (pregnancy-induced) hypertension without significant proteinuria, unspecified trimester: O13.9

## 2019-06-17 LAB — CBC
HCT: 30.4 % — ABNORMAL LOW (ref 36.0–46.0)
HCT: 29.5 % — ABNORMAL LOW (ref 36.0–46.0)
Hemoglobin: 10 g/dL — ABNORMAL LOW (ref 12.0–15.0)
Hemoglobin: 9.9 g/dL — ABNORMAL LOW (ref 12.0–15.0)
MCH: 34 pg (ref 26.0–34.0)
MCH: 34.3 pg — ABNORMAL HIGH (ref 26.0–34.0)
MCHC: 32.9 g/dL (ref 30.0–36.0)
MCHC: 33.6 g/dL (ref 30.0–36.0)
MCV: 103.4 fL — ABNORMAL HIGH (ref 80.0–100.0)
MCV: 102.1 fL — ABNORMAL HIGH (ref 80.0–100.0)
Platelets: 224 10*3/uL (ref 150–400)
Platelets: 227 10*3/uL (ref 150–400)
RBC: 2.94 MIL/uL — ABNORMAL LOW (ref 3.87–5.11)
RBC: 2.89 MIL/uL — ABNORMAL LOW (ref 3.87–5.11)
RDW: 13 % (ref 11.5–15.5)
RDW: 13 % (ref 11.5–15.5)
WBC: 8.2 10*3/uL (ref 4.0–10.5)
WBC: 8.3 10*3/uL (ref 4.0–10.5)
nRBC: 0.2 % (ref 0.0–0.2)
nRBC: 0.2 % (ref 0.0–0.2)

## 2019-06-17 LAB — PROTEIN / CREATININE RATIO, URINE
Creatinine, Urine: 43.47 mg/dL
Total Protein, Urine: <6 mg/dL

## 2019-06-17 LAB — COMPREHENSIVE METABOLIC PANEL
ALT: 41 U/L (ref 0–44)
AST: 39 U/L (ref 15–41)
Albumin: 2.4 g/dL — ABNORMAL LOW (ref 3.5–5.0)
Alkaline Phosphatase: 60 U/L (ref 38–126)
Anion gap: 11 (ref 5–15)
BUN: 8 mg/dL (ref 6–20)
CO2: 24 mmol/L (ref 22–32)
Calcium: 8.6 mg/dL — ABNORMAL LOW (ref 8.9–10.3)
Chloride: 104 mmol/L (ref 98–111)
Creatinine, Ser: 0.53 mg/dL (ref 0.44–1.00)
GFR calc Af Amer: >60 mL/min (ref >60)
GFR calc non Af Amer: >60 mL/min (ref >60)
Glucose, Bld: 78 mg/dL (ref 70–99)
Potassium: 3.1 mmol/L — ABNORMAL LOW (ref 3.5–5.1)
Sodium: 139 mmol/L (ref 135–145)
Total Bilirubin: 0.5 mg/dL (ref 0.3–1.2)
Total Protein: 5.3 g/dL — ABNORMAL LOW (ref 6.5–8.1)

## 2019-06-17 LAB — ECHOCARDIOGRAM COMPLETE
Height: 63 in
Weight: 3456 oz

## 2019-06-17 LAB — CREATININE, SERUM
Creatinine, Ser: 0.63 mg/dL (ref 0.44–1.00)
GFR calc Af Amer: >60 mL/min (ref >60)
GFR calc non Af Amer: >60 mL/min (ref >60)

## 2019-06-17 LAB — SARS CORONAVIRUS 2 (TAT 6-24 HRS): SARS Coronavirus 2: NEGATIVE

## 2019-06-17 MED ORDER — FUROSEMIDE 10 MG/ML IJ SOLN
40.0000 mg | Freq: Two times a day (BID) | INTRAMUSCULAR | Status: AC
  Administered 2019-06-17 – 2019-06-18 (×3): 40 mg via INTRAVENOUS
  Filled 2019-06-17 (×5): qty 4

## 2019-06-17 MED ORDER — LABETALOL HCL 5 MG/ML IV SOLN
40.0000 mg | INTRAVENOUS | Status: DC | PRN
  Administered 2019-06-17: 40 mg via INTRAVENOUS
  Filled 2019-06-17: qty 8

## 2019-06-17 MED ORDER — SODIUM CHLORIDE 0.9% FLUSH: 3.0000 mL | INTRAVENOUS | Status: DC | PRN

## 2019-06-17 MED ORDER — HYDRALAZINE HCL 20 MG/ML IJ SOLN: 10.0000 mg | INTRAMUSCULAR | Status: DC | PRN

## 2019-06-17 MED ORDER — SODIUM CHLORIDE 0.9 % IV SOLN: 250.0000 mL | INTRAVENOUS | Status: DC | PRN

## 2019-06-17 MED ORDER — LABETALOL HCL 5 MG/ML IV SOLN
80.0000 mg | INTRAVENOUS | Status: DC | PRN
  Administered 2019-06-17: 80 mg via INTRAVENOUS
  Filled 2019-06-17: qty 16

## 2019-06-17 MED ORDER — ACETAMINOPHEN 500 MG PO TABS
1000.0000 mg | ORAL_TABLET | Freq: Once | ORAL | Status: AC
  Administered 2019-06-17: 11:00:00 1000 mg via ORAL
  Filled 2019-06-17: qty 2

## 2019-06-17 MED ORDER — HYDRALAZINE HCL 20 MG/ML IJ SOLN: 5.0000 mg | INTRAMUSCULAR | Status: DC | PRN

## 2019-06-17 MED ORDER — FUROSEMIDE 10 MG/ML IJ SOLN
20.0000 mg | Freq: Once | INTRAMUSCULAR | Status: AC
  Administered 2019-06-17: 15:00:00 20 mg via INTRAVENOUS
  Filled 2019-06-17 (×2): qty 2

## 2019-06-17 MED ORDER — PRENATAL MULTIVITAMIN CH
1.0000 | ORAL_TABLET | Freq: Every day | ORAL | Status: DC
  Administered 2019-06-18 – 2019-06-19 (×2): 1 via ORAL
  Filled 2019-06-17 (×3): qty 1

## 2019-06-17 MED ORDER — CEPHALEXIN 500 MG PO CAPS
500.0000 mg | ORAL_CAPSULE | Freq: Three times a day (TID) | ORAL | Status: AC
  Administered 2019-06-17 – 2019-06-18 (×4): 500 mg via ORAL
  Filled 2019-06-17 (×6): qty 1

## 2019-06-17 MED ORDER — LABETALOL HCL 5 MG/ML IV SOLN: 20.0000 mg | INTRAVENOUS | Status: DC | PRN

## 2019-06-17 MED ORDER — SODIUM CHLORIDE 0.9% FLUSH
3.0000 mL | Freq: Two times a day (BID) | INTRAVENOUS | Status: DC
  Administered 2019-06-18 – 2019-06-19 (×4): 3 mL via INTRAVENOUS

## 2019-06-17 MED ORDER — IBUPROFEN 600 MG PO TABS
600.0000 mg | ORAL_TABLET | Freq: Four times a day (QID) | ORAL | Status: DC
  Administered 2019-06-17 – 2019-06-19 (×7): 600 mg via ORAL
  Filled 2019-06-17 (×8): qty 1

## 2019-06-17 MED ORDER — HYDRALAZINE HCL 20 MG/ML IJ SOLN
10.0000 mg | INTRAMUSCULAR | Status: DC | PRN
  Administered 2019-06-17: 10 mg via INTRAVENOUS
  Filled 2019-06-17: qty 1

## 2019-06-17 MED ORDER — LABETALOL HCL 5 MG/ML IV SOLN: 40.0000 mg | INTRAVENOUS | Status: DC | PRN

## 2019-06-17 MED ORDER — ENOXAPARIN SODIUM 40 MG/0.4ML ~~LOC~~ SOLN
40.0000 mg | SUBCUTANEOUS | Status: DC
  Administered 2019-06-17 – 2019-06-18 (×2): 40 mg via SUBCUTANEOUS
  Filled 2019-06-17 (×3): qty 0.4

## 2019-06-17 MED ORDER — LABETALOL HCL 5 MG/ML IV SOLN
20.0000 mg | INTRAVENOUS | Status: DC | PRN
  Administered 2019-06-17: 12:00:00 20 mg via INTRAVENOUS
  Filled 2019-06-17: qty 4

## 2019-06-17 MED ORDER — FERROUS SULFATE 325 (65 FE) MG PO TABS
325.0000 mg | ORAL_TABLET | Freq: Every day | ORAL | Status: DC
  Administered 2019-06-18 – 2019-06-19 (×2): 325 mg via ORAL
  Filled 2019-06-17 (×3): qty 1

## 2019-06-17 MED ORDER — NIFEDIPINE ER OSMOTIC RELEASE 30 MG PO TB24
30.0000 mg | ORAL_TABLET | Freq: Two times a day (BID) | ORAL | Status: DC
  Administered 2019-06-17 – 2019-06-19 (×4): 30 mg via ORAL
  Filled 2019-06-17 (×5): qty 1

## 2019-06-17 NOTE — Telephone Encounter (Signed)
Pt called and spoke to [facility] about symptoms she was having, I called pt back and pt states had trouble sleeping last night stating it felt like her heart was fluttering and had SOB. This morning she has a HA and Heather instructed pt to sit for 5 min and then take her BP. BP was 172/100. Instructed pt to go to [facility] for evaluation due to her symptoms, [facility] provider notified of pt coming in.

## 2019-06-17 NOTE — H&P (Signed)
History     CSN: [id number]  Arrival date and time: 06/17/19 1015   First Provider Initiated Contact with Patient 06/17/19 1048      Chief Complaint  Patient presents with  . Hypertension  . Shortness of Breath   HPI  Ms.  Danielle Waters is a 28 y.o. year old [id number] female at 3 days s/p NSVD who presents to MAU reporting H/A, SOB and high BPs. She had a vaginal delivery on 06/14/2019 after being admitted IOL for severe PEC. She was given MgSO4 x 24 hours. She was d/c'd home yesterday 06/16/2019 and started on Lisinopril. She last took Lisinopril, Ibuprofen and Keflex at 0830 this morning. She called her OB office this morning to report a BP of 172/100, no improvement of the SOB she had overnight and her H/A that has not been relieved by Ibuprofen. She was told by the RN to come here for evaluation.    Past Medical History:  Diagnosis Date  . Influenza A   . Medical history non-contributory   . Pregnancy induced hypertension     Past Surgical History:  Procedure Laterality Date  . APPENDECTOMY    . DILATION AND CURETTAGE OF UTERUS    . LAPAROSCOPIC APPENDECTOMY N/A 10/30/2018   Procedure: APPENDECTOMY LAPAROSCOPIC;  Surgeon: Henrene Dodge, MD;  Location: [facility] ORS;  Service: General;  Laterality: N/A;    Family History  Problem Relation Age of Onset  . Diabetes Mother   . Hypertension Mother   . Diabetes Maternal Grandmother   . Hypertension Maternal Grandmother     Social History   Tobacco Use  . Smoking status: Never Smoker  . Smokeless tobacco: Never Used  Substance Use Topics  . Alcohol use: Not Currently  . Drug use: No    Allergies: No Known Allergies  Medications Prior to Admission  Medication Sig Dispense Refill Last Dose  . cephALEXin (KEFLEX) 500 MG capsule Take 1 capsule (500 mg total) by mouth 3 (three) times daily for 3 days. 9 capsule 0   . ferrous sulfate (FERROUSUL) 325 (65 FE) MG tablet Take 1 tablet (325 mg total) by mouth daily with breakfast.  90 tablet 1   . ibuprofen (ADVIL) 600 MG tablet Take 1 tablet (600 mg total) by mouth every 6 (six) hours. 30 tablet 0   . lisinopril (ZESTRIL) 10 MG tablet Take 1 tablet (10 mg total) by mouth daily. 30 tablet 1   . Prenatal Vit-Fe Fumarate-FA (MULTIVITAMIN-PRENATAL) 27-0.8 MG TABS tablet Take 1 tablet by mouth daily at 12 noon.       Review of Systems  Constitutional: Positive for fatigue.  HENT: Negative.   Eyes: Negative.   Respiratory: Positive for shortness of breath (since last night).   Cardiovascular: Positive for leg swelling (not new issue).  Endocrine: Negative.   Genitourinary: Positive for vaginal bleeding (thin, watery with no clots).  Musculoskeletal: Negative.   Skin: Negative.   Allergic/Immunologic: Negative.   Neurological: Positive for headaches (frontal; 7/10).  Hematological: Negative.   Psychiatric/Behavioral: Negative.    Physical Exam   Patient Vitals for the past 24 hrs:  BP Temp Temp src Pulse Resp SpO2 Height Weight  06/17/19 1616 (!) 131/55 - - (!) 51 - - - -  06/17/19 1610 (!) 142/57 - - (!) 54 - - - -  06/17/19 1537 (!) 157/67 - - (!) 45 - - - -  06/17/19 1510 (!) 143/73 - - 73 - - - -  06/17/19 1435 - - - - -  99 % - -  06/17/19 1431 (!) 142/66 - - (!) 55 - - - -  06/17/19 1430 - - - - - 99 % - -  06/17/19 1416 140/62 - - (!) 58 - - - -  06/17/19 1410 - - - - - 99 % - -  06/17/19 1405 - - - - - 99 % - -  06/17/19 1401 (!) 143/60 - - (!) 52 - - - -  06/17/19 1400 - - - - - 99 % - -  06/17/19 1355 - - - - - 100 % - -  06/17/19 1350 - - - - - 100 % - -  06/17/19 1346 (!) 144/77 - - (!) 55 - - - -  06/17/19 1345 - - - - - 100 % - -  06/17/19 1340 - - - - - 99 % - -  06/17/19 1335 - - - - - 100 % - -  06/17/19 1330 (!) 157/70 - - (!) 45 - 100 % - -  06/17/19 1301 (!) 172/77 - - (!) 44 - - - -  06/17/19 1251 (!) 163/83 - - (!) 41 - - - -  06/17/19 1250 - - - - - 99 % - -  06/17/19 1245 - - - - - 98 % - -  06/17/19 1241 (!) 165/79 - - (!) 42 -  - - -  06/17/19 1240 - - - - - 98 % - -  06/17/19 1231 (!) 166/80 - - (!) 44 - - - -  06/17/19 1225 - - - - - 99 % - -  06/17/19 1221 (!) 157/77 - - (!) 42 - - - -  06/17/19 1220 - - - - - 97 % - -  06/17/19 1215 - - - - - 97 % - -  06/17/19 1211 (!) 168/126 - - (!) 50 - - - -  06/17/19 1210 - - - - - 97 % - -  06/17/19 1205 - - - - - 96 % - -  06/17/19 1201 (!) 156/77 - - - - - - -  06/17/19 1200 - - - - - 97 % - -  06/17/19 1156 (!) 164/79 - - - - - - -  06/17/19 1146 (!) 176/81 - - (!) 42 - - - -  06/17/19 1140 - - - - - 98 % - -  06/17/19 1136 (!) 172/78 - - (!) 40 - - - -  06/17/19 1101 (!) 185/78 - - (!) 43 - - - -  06/17/19 1046 (!) 173/78 - - (!) 40 - - - -  06/17/19 1045 - - - - - 99 % - -  06/17/19 1040 - - - - - 99 % - -  06/17/19 1039 (!) 183/80 98.3 F (36.8 C) Oral (!) 41 20 - - -  06/17/19 1034 - - - - - - 5\' 3"  (1.6 m) 98 kg    Physical Exam  Nursing note and vitals reviewed. Constitutional: She is oriented to person, place, and time. She appears well-developed and well-nourished.  HENT:  Head: Normocephalic and atraumatic.  Eyes: Pupils are equal, round, and reactive to light.  Neck: Normal range of motion.  Cardiovascular: Regular rhythm, normal heart sounds and intact distal pulses.  Respiratory: Effort normal and breath sounds normal.  GI: Soft. Bowel sounds are normal. There is abdominal tenderness ("feels uncomfortable") in the right upper quadrant.  Musculoskeletal:  General: Edema present.  Neurological: She is alert and oriented to person, place, and time.  Normal DTRs RT, absent DTRs LT  Skin: Skin is warm and dry.  Psychiatric: She has a normal mood and affect. Her behavior is normal. Judgment and thought content normal.   Reassessment @ 1415: Patient reports eating [employer] fast food @ ~ 1400, now H/A improved from 7/10 down to 3/10 MAU Course  Procedures  MDM CCUA CBC CMP P/C Ratio Serial BP's  Labetalol Protocol CXR 2 view Tylenol  1000 mg po Echocardiogram Lasix 20 mg IVP  *Consult with Dr. Shawnie PonsPratt @ 1400 - notified of patient's complaints, assessments, lab & CXR results, recommended additions to tx plan order echocardiogram and give Lasix 20 mg  Update Dr. Shawnie PonsPratt @ 1625 - report normal echocardiogram --> recommended tx plan: re-admit to OBSCU for BP control and administration of Lasix.  Results for orders placed or performed during the hospital encounter of 06/17/19 (from the past 24 hour(s))  Protein / creatinine ratio, urine     Status: None   Collection Time: 06/17/19 11:26 AM  Result Value Ref Range   Creatinine, Urine 43.47 mg/dL   Total Protein, Urine <6.0 mg/dL   Protein Creatinine Ratio        0.00 - 0.15 mg/mg[Cre]  CBC     Status: Abnormal   Collection Time: 06/17/19 11:34 AM  Result Value Ref Range   WBC 8.2 4.0 - 10.5 K/uL   RBC 2.94 (L) 3.87 - 5.11 MIL/uL   Hemoglobin 10.0 (L) 12.0 - 15.0 g/dL   HCT 16.130.4 (L) 09.636.0 - 04.546.0 %   MCV 103.4 (H) 80.0 - 100.0 fL   MCH 34.0 26.0 - 34.0 pg   MCHC 32.9 30.0 - 36.0 g/dL   RDW 40.913.0 81.111.5 - 91.415.5 %   Platelets 224 150 - 400 K/uL   nRBC 0.2 0.0 - 0.2 %  Comprehensive metabolic panel     Status: Abnormal   Collection Time: 06/17/19 11:34 AM  Result Value Ref Range   Sodium 139 135 - 145 mmol/L   Potassium 3.1 (L) 3.5 - 5.1 mmol/L   Chloride 104 98 - 111 mmol/L   CO2 24 22 - 32 mmol/L   Glucose, Bld 78 70 - 99 mg/dL   BUN 8 6 - 20 mg/dL   Creatinine, Ser 7.820.53 0.44 - 1.00 mg/dL   Calcium 8.6 (L) 8.9 - 10.3 mg/dL   Total Protein 5.3 (L) 6.5 - 8.1 g/dL   Albumin 2.4 (L) 3.5 - 5.0 g/dL   AST 39 15 - 41 U/L   ALT 41 0 - 44 U/L   Alkaline Phosphatase 60 38 - 126 U/L   Total Bilirubin 0.5 0.3 - 1.2 mg/dL   GFR calc non Af Amer >60 >60 mL/min   GFR calc Af Amer >60 >60 mL/min   Anion gap 11 5 - 15      Dg Chest 2 View  Result Date: 06/17/2019 CLINICAL DATA:  Shortness of breath, recent postpartum, preeclampsia EXAM: CHEST - 2 VIEW COMPARISON:  None. FINDINGS:  Cardiomegaly. Small bilateral pleural effusions. The visualized skeletal structures are unremarkable. IMPRESSION: Cardiomegaly with small bilateral pleural effusions. Electronically Signed   By: Lauralyn PrimesAlex  Bibbey M.D.   On: 06/17/2019 13:21    Assessment and Plan  1. Shortness of breath at rest 2. Hypertension in pregnancy, pre-eclampsia, severe, delivered/postpartum 3. Postpartum edema - Admit to OBSCU  - Routine orders - Continue Lasix IVP - Discussed with patient the  normal CXR and echocardiogram results, but unsure why SOB persists. - Explained that Dr. Shawnie PonsPratt is readmitting her for further evaluation - Dr. Shawnie PonsPratt will enter OBSCU orders - Dr. Shawnie PonsPratt [facility] care [facility]    Marlee Armenteros, MSN, CNM 06/17/2019, 10:57 AM

## 2019-06-17 NOTE — Progress Notes (Signed)
  Echocardiogram 2D Echocardiogram has been performed.  Danielle Waters 06/17/2019, 3:18 PM

## 2019-06-17 NOTE — MAU Note (Signed)
Pt states she has been having SOB accompainied abdm cramping intermittently since last pm. Takes meds for PreE & states her last BP was 172/100 at 0830.  States h/a of 7 on 0-10 pain scale, has not taken meds for h/a.  States possible epigastric pain.  1-2+ edema noted, DTRs +1, no clonus. Pt has SVD on 8/30 with 24hrs magnesium sulfate admin.

## 2019-06-18 ENCOUNTER — Telehealth: Payer: BC Managed Care – PPO | Admitting: Obstetrics and Gynecology

## 2019-06-18 MED ORDER — TRIAMTERENE-HCTZ 37.5-25 MG PO TABS
1.0000 | ORAL_TABLET | Freq: Every day | ORAL | Status: DC
  Administered 2019-06-18 – 2019-06-19 (×2): 1 via ORAL
  Filled 2019-06-18 (×3): qty 1

## 2019-06-18 MED ORDER — POTASSIUM CHLORIDE CRYS ER 20 MEQ PO TBCR
20.0000 meq | EXTENDED_RELEASE_TABLET | Freq: Two times a day (BID) | ORAL | Status: DC
  Administered 2019-06-18 – 2019-06-19 (×3): 20 meq via ORAL
  Filled 2019-06-18 (×3): qty 1

## 2019-06-18 MED ORDER — ZOLPIDEM TARTRATE 5 MG PO TABS
5.0000 mg | ORAL_TABLET | Freq: Every evening | ORAL | Status: DC | PRN
  Administered 2019-06-18: 5 mg via ORAL
  Filled 2019-06-18: qty 1

## 2019-06-18 NOTE — Progress Notes (Signed)
Post Partum Day 4 Subjective: no complaints, up ad lib, voiding and tolerating PO  NO SOB feels much much better, feels like herself  Objective: Blood pressure 129/61, pulse (!) 49, temperature 98.7 F (37.1 C), temperature source Oral, resp. rate 16, height 5\' 3"  (1.6 m), weight 98 kg, SpO2 98 %, unknown if currently breastfeeding.  Physical Exam:  General: alert, cooperative and no distress Lochia: appropriate Uterine Fundus: firm Incision:  DVT Evaluation: No evidence of DVT seen on physical exam.  Recent Labs    06/17/19 1134 06/17/19 1705  HGB 10.0* 9.9*  HCT 30.4* 29.5*    Assessment/Plan: Plan for discharge tomorrow  On procardia 30 xl BID and I added maxzide 37.5/25 to go home with first dose today plus some K+   LOS: 1 day   Florian Buff 06/18/2019, 9:26 AM

## 2019-06-18 NOTE — Lactation Note (Signed)
This note was copied from a baby's chart. Lactation Consultation Note  Patient Name: Boy Andrya Roppolo YBRKV'[id number] Date: 06/18/2019 Reason for consult: Follow-up assessment;Primapara;1st time breastfeeding;Late-preterm 34-36.6wks;Infant < 6lbs;NICU baby Type of Endocrine Disorder?: PCOS(per mom with breast changes)  Baby is 32 hours old in NICU.  [place] visited mom in her room and she mentioned she has pumped 3-4 times in the last 24 hours and finally started getting 2-3 ml .  [place] praised mom for her pumping. Reviewed supply and demand / and stressed the importance of being consistent with pumping 8-10 times a day both breast for 15 -20 mins.  LC also recommended since mom has a DEBP at home to leave her DEBP kit ion NICU and plan to pump while visiting the baby in NICU after STS or feeding.  LC discussed adding power pumping once a day ( 20 mins on 10 mins off / or 10 min on 10 min off over 60 mins ).  Mom mentioned the baby may be able to deed today. LC stressed its even more important be more consistent with pumping.  LC recommended when baby can go to the breast to have the NICU RN call for [facility] assessment.  Mom aware of the [facility] resources post D/C at [facility].    Maternal Data Has patient been taught Hand Expression?: Yes(per mom has been hand epressing x 3-4 in the last 24 hours)  Feeding    LATCH Score                   Interventions Interventions: Breast feeding basics reviewed  Lactation Tools Discussed/Used Tools: Pump Flange Size: 24 Breast pump type: Double-Electric Breast Pump Pump Review: Setup, frequency, and cleaning Initiated by:: MAI Date initiated:: 06/15/19   Consult Status Consult Status: PRN Follow-up type: In-patient(baby in NICU)    Myer Haff 06/18/2019, 9:13 AM

## 2019-06-19 ENCOUNTER — Ambulatory Visit (HOSPITAL_COMMUNITY): Payer: BC Managed Care – PPO

## 2019-06-19 MED ORDER — NIFEDIPINE ER 30 MG PO TB24: 30.0000 mg | ORAL_TABLET | Freq: Two times a day (BID) | ORAL | 1 refills | Status: AC

## 2019-06-19 MED ORDER — FERROUS SULFATE 325 (65 FE) MG PO TABS: 325.0000 mg | ORAL_TABLET | Freq: Every day | ORAL | 1 refills | Status: AC

## 2019-06-19 MED ORDER — COCONUT OIL OIL
1.0000 "application " | TOPICAL_OIL | Status: DC | PRN
  Administered 2019-06-19: 1 via TOPICAL

## 2019-06-19 MED ORDER — TRIAMTERENE-HCTZ 37.5-25 MG PO TABS: 1.0000 | ORAL_TABLET | Freq: Every day | ORAL | 1 refills | Status: AC

## 2019-06-19 MED FILL — FERROUS SULFATE 325 MG TAB: 325 (65 FE) | 90 days supply | Qty: 90 | Fill #0

## 2019-06-19 MED FILL — NIFEdipine ER 30 MG TB24: 30 | 30 days supply | Qty: 60 | Fill #0

## 2019-06-19 MED FILL — TRIAMTERENE-HCTZ 37.5-25 MG: 37.5-25 | 30 days supply | Qty: 30 | Fill #0

## 2019-06-19 NOTE — Progress Notes (Signed)
Discharge teaching complete with pt. Pt understood all information and did not have any questions. Pt discharged home with family.  

## 2019-06-19 NOTE — Lactation Note (Signed)
This note was copied from a baby's chart. Lactation Consultation Note  Patient Name: Danielle Waters [id number] Date: 06/19/2019 Reason for consult: Follow-up assessment;1st time breastfeeding;Primapara;Late-preterm 34-36.6wks;Infant < 6lbs;NICU baby;Infant weight loss  Visited with mom of a 13 days old ETI NICU female who is currently on donor and mother's milk. Mom is a P1 and she is now pumping every 3-4 hours and getting about 9 ml of EBM combined per pumping session. Her milk just stated to come in, mom using # 27 flanges. Mom is complaining of some soreness and dryness on her nipples, asked her RN to bring coconut oil and use it prior pumping. Reviewed prevention and treatment for sore nipples.  Mom has started putting baby to the breast and requested a NICU consult at 2 pm. She told LC she's been pumping round the clock and waking up at night to pump also. Reviewed stress and sleeping cycle, pumping schedule, bilateral pumping (she's only pumping on one side) and feeding cues, baby started cueing last night.  Feeding plan:  1. Encouraged mom to pump every 2-3 hours during the day and get 4-6 hours of uninterrupted sleep at night, at least 8 pumping sessions in 24 hours 2. Mom will apply coconut oil prior pumping  3. Will check with NICU staff to start putting baby to breast on cues to "practice at the breast"  Mom reported all questions and concerns were answered, she's aware of [place] OP services and will call PRN.  Maternal Data    Feeding Feeding Type: Donor Breast Milk  LATCH Score                   Interventions Interventions: Breast feeding basics reviewed;Coconut oil  Lactation Tools Discussed/Used Tools: Coconut oil   Consult Status Consult Status: Follow-up Date: 06/19/19 Follow-up type: In-patient    Analyce Tavares Francene Boyers 06/19/2019, 12:47 PM

## 2019-06-19 NOTE — Lactation Note (Signed)
This note was copied from a baby's chart. Lactation Consultation Note  Patient Name: Danielle Waters [id number] Date: 06/19/2019 Reason for consult: Follow-up assessment;NICU baby;1st time breastfeeding;Primapara;Late-preterm 34-36.6wks;Infant < 6lbs;Infant weight loss;Mother's request  Mom requested a feeding assist at 2 pm, but baby wasn't taken to breast until 2:09 pm, NICU RN had to change baby's diaper and get baby ready, baby currently having an umbilical line, so no STS is possible at this time, baby had to be completely swaddled when taken to the breast.  Assisted mom with hand expression, colostrum was flowing nicely; baby able to cue but having difficulties latching on his own, LC did sandwich hold and baby able to latch and do a few sucks but not actual transfer, it was more like licking and "practicing at the breast" mom was already prepared to have these expectations for her first attempt and she was happy she was able to try. Mom will let lactation know when baby's central line is off and when he's able to do STS to request another feeding assist.  Mom got discharged today, advised her to leave her DEBP kit in baby's room in the NICU so she can pump while at the hospital. Mom also asked for ways to get dad involved, discussed some option about dad helping with bottle feedings and burping baby.  Feeding plan:  1. Encouraged mom to pump every 2-3 hours during the day and get 4-6 hours of uninterrupted sleep at night, at least 8 pumping sessions in 24 hours 2. Mom will apply coconut oil prior pumping and will try flanges # 24 3. Will check with NICU staff to do another feeding assist once baby doesn't have a central line anymore  Mom reported all questions and concerns were answered, she's aware of [place] OP services and will call PRN.  Maternal Data    Feeding Feeding Type: Donor Breast Milk Nipple Type: Nfant Slow Flow (purple)  LATCH Score                    Interventions Interventions: Breast feeding basics reviewed;Assisted with latch;Breast massage;Breast compression;Hand express;Support pillows  Lactation Tools Discussed/Used Tools: Coconut oil   Consult Status Consult Status: PRN Date: 06/19/19 Follow-up type: In-patient    Danielle Waters 06/19/2019, 2:43 PM

## 2019-06-19 NOTE — Discharge Summary (Signed)
Physician Discharge Summary  Patient ID: Danielle ReedyCourtney Waters MRN: [id number] DOB/AGE: 30/11/1990 30 y.o.  Admit date: 06/17/2019 Discharge date: 06/19/2019   Discharge Diagnoses:  Active Problems:   Hypertension in pregnancy, pre-eclampsia, severe, delivered/postpartum   Shortness of breath at rest   Pulmonary edema   Consults: None  Significant Diagnostic Studies: CBC    Component Value Date/Time   WBC 8.3 06/17/2019 1705   RBC 2.89 (L) 06/17/2019 1705   HGB 9.9 (L) 06/17/2019 1705   HGB 10.7 (L) 04/14/2019 0822   HCT 29.5 (L) 06/17/2019 1705   HCT 30.5 (L) 04/14/2019 0822   PLT 227 06/17/2019 1705   PLT 262 04/14/2019 0822   MCV 102.1 (H) 06/17/2019 1705   MCV 98 (H) 04/14/2019 0822   MCH 34.3 (H) 06/17/2019 1705   MCHC 33.6 06/17/2019 1705   RDW 13.0 06/17/2019 1705   RDW 12.0 04/14/2019 0822   LYMPHSABS 1.8 01/14/2019 0919   MONOABS 0.5 10/30/2018 0658   EOSABS 0.0 01/14/2019 0919   BASOSABS 0.0 01/14/2019 0919   CMP     Component Value Date/Time   NA 139 06/17/2019 1134   NA 140 08/26/2017 1546   K 3.1 (L) 06/17/2019 1134   CL 104 06/17/2019 1134   CO2 24 06/17/2019 1134   GLUCOSE 78 06/17/2019 1134   BUN 8 06/17/2019 1134   BUN 10 08/26/2017 1546   CREATININE 0.63 06/17/2019 1705   CALCIUM 8.6 (L) 06/17/2019 1134   PROT 5.3 (L) 06/17/2019 1134   ALBUMIN 2.4 (L) 06/17/2019 1134   AST 39 06/17/2019 1134   ALT 41 06/17/2019 1134   ALKPHOS 60 06/17/2019 1134   BILITOT 0.5 06/17/2019 1134   GFRNONAA >60 06/17/2019 1705   GFRAA >60 06/17/2019 1705   Creatinine, Urine mg/dL 40.9843.47   Total Protein, Urine mg/dL <1.1<6.0   Protein Creatinine Ratio 0.00 - 0.15 mg/mg      Comment: RESULT BELOW REPORTABLE RANGE,  UNABLE TO CALCULATE.    ECHO IMPRESSIONS    1. The left ventricle has normal systolic function with an ejection fraction of 60-65%. The cavity size was normal. Left ventricular diastolic parameters were normal. Elevated left ventricular end-diastolic  pressure.  2. The right ventricle has normal systolic function. The cavity was normal. There is no increase in right ventricular wall thickness.  3. Left atrial size was mildly dilated.  4. The aorta is normal unless otherwise noted.  Dg Chest 2 View  Result Date: 06/17/2019 CLINICAL DATA:  Shortness of breath, recent postpartum, preeclampsia EXAM: CHEST - 2 VIEW COMPARISON:  None. FINDINGS: Cardiomegaly. Small bilateral pleural effusions. The visualized skeletal structures are unremarkable. IMPRESSION: Cardiomegaly with small bilateral pleural effusions. Electronically Signed   By: Lauralyn PrimesAlex  Bibbey M.D.   On: 06/17/2019 13:21     Hospital Course: Admitted with worsening pp HTN and pulm edema on Day ppd 3. Received Lasix, and changed BP med from ACE-I to CCB. BP improved. Negative 5 Liters while in house. Stable for Discharge. Had received Magnesium in the pp period.      Disposition:  Discharge disposition: 01-Home or Self Care       Discharged Condition: improved  Discharge Instructions    Call MD for:  difficulty breathing, headache or visual disturbances   Complete by: As directed    Diet - low sodium heart healthy   Complete by: As directed    Increase activity slowly   Complete by: As directed      Allergies as of 06/19/2019  No Known Allergies     Medication List    STOP taking these medications   cephALEXin 500 MG capsule Commonly known as: KEFLEX   lisinopril 10 MG tablet Commonly known as: ZESTRIL Replaced by: NIFEdipine 30 MG 24 hr tablet     TAKE these medications   ferrous sulfate 325 (65 FE) MG tablet Commonly known as: FerrouSul Take 1 tablet (325 mg total) by mouth daily with breakfast.   ibuprofen 600 MG tablet Commonly known as: ADVIL Take 1 tablet (600 mg total) by mouth every 6 (six) hours.   multivitamin-prenatal 27-0.8 MG Tabs tablet Take 1 tablet by mouth daily at 12 noon.   NIFEdipine 30 MG 24 hr tablet Commonly known as: ADALAT  CC Take 1 tablet (30 mg total) by mouth 2 (two) times daily. Replaces: lisinopril 10 MG tablet   triamterene-hydrochlorothiazide 37.5-25 MG tablet Commonly known as: MAXZIDE-25 Take 1 tablet by mouth daily. Start taking on: June 20, 2019      [place] for [employer] at [facility] Follow up.   Specialty: Obstetrics and Gynecology Contact information: [address] [place] [phone]          Signed: Donnamae Jude 06/19/2019, 1:03 PM

## 2019-06-19 NOTE — Discharge Instructions (Signed)
Postpartum Hypertension °Postpartum hypertension is high blood pressure that remains higher than normal after childbirth. You may not realize that you have postpartum hypertension if your blood pressure is not being checked regularly. In most cases, postpartum hypertension will go away on its own, usually within a week of delivery. However, for some women, medical treatment is required to prevent serious complications, such as seizures or stroke. °What are the causes? °This condition may be caused by one or more of the following: °· Hypertension that existed before pregnancy (chronic hypertension). °· Hypertension that comes on as a result of pregnancy (gestational hypertension). °· Hypertensive disorders during pregnancy (preeclampsia) or seizures in women who have high blood pressure during pregnancy (eclampsia). °· A condition in which the liver, platelets, and red blood cells are damaged during pregnancy (HELLP syndrome). °· A condition in which the thyroid produces too much hormones (hyperthyroidism). °· Other rare problems of the nerves (neurological disorders) or blood disorders. °In some cases, the cause may not be known. °What increases the risk? °The following factors may make you more likely to develop this condition: °· Chronic hypertension. In some cases, this may not have been diagnosed before pregnancy. °· Obesity. °· Type 2 diabetes. °· Kidney disease. °· History of preeclampsia or eclampsia. °· Other medical conditions that change the level of hormones in the body (hormonal imbalance). °What are the signs or symptoms? °As with all types of hypertension, postpartum hypertension may not have any symptoms. Depending on how high your blood pressure is, you may experience: °· Headaches. These may be mild, moderate, or severe. They may also be steady, constant, or sudden in onset (thunderclap headache). °· Changes in your ability to see (visual changes). °· Dizziness. °· Shortness of breath. °· Swelling  of your hands, feet, lower legs, or face. In some cases, you may have swelling in more than one of these locations. °· Heart palpitations or a racing heartbeat. °· Difficulty breathing while lying down. °· Decrease in the amount of urine that you pass. °Other rare signs and symptoms may include: °· Sweating more than usual. This lasts longer than a few days after delivery. °· Chest pain. °· Sudden dizziness when you get up from sitting or lying down. °· Seizures. °· Nausea or vomiting. °· Abdominal pain. °How is this diagnosed? °This condition may be diagnosed based on the results of a physical exam, blood pressure measurements, and blood and urine tests. °You may also have other tests, such as a CT scan or an MRI, to check for other problems of postpartum hypertension. °How is this treated? °If blood pressure is high enough to require treatment, your options may include: °· Medicines to reduce blood pressure (antihypertensives). Tell your health care provider if you are breastfeeding or if you plan to breastfeed. There are many antihypertensive medicines that are safe to take while breastfeeding. °· Stopping medicines that may be causing hypertension. °· Treating medical conditions that are causing hypertension. °· Treating the complications of hypertension, such as seizures, stroke, or kidney problems. °Your health care provider will also continue to monitor your blood pressure closely until it is within a safe range for you. °Follow these instructions at home: °· Take over-the-counter and prescription medicines only as told by your health care provider. °· Return to your normal activities as told by your health care provider. Ask your health care provider what activities are safe for you. °· Do not use any products that contain nicotine or tobacco, such as cigarettes and e-cigarettes. If   you need help quitting, ask your health care provider. °· Keep all follow-up visits as told by your health care provider. This  is important. °Contact a health care provider if: °· Your symptoms get worse. °· You have new symptoms, such as: °? A headache that does not get better. °? Dizziness. °? Visual changes. °Get help right away if: °· You suddenly develop swelling in your hands, ankles, or face. °· You have sudden, rapid weight gain. °· You develop difficulty breathing, chest pain, racing heartbeat, or heart palpitations. °· You develop severe pain in your abdomen. °· You have any symptoms of a stroke. "BE FAST" is an easy way to remember the main warning signs of a stroke: °? B - Balance. Signs are dizziness, sudden trouble walking, or loss of balance. °? E - Eyes. Signs are trouble seeing or a sudden change in vision. °? F - Face. Signs are sudden weakness or numbness of the face, or the face or eyelid drooping on one side. °? A - Arms. Signs are weakness or numbness in an arm. This happens suddenly and usually on one side of the body. °? S - Speech. Signs are sudden trouble speaking, slurred speech, or trouble understanding what people say. °? T - Time. Time to call emergency services. Write down what time symptoms started. °· You have other signs of a stroke, such as: °? A sudden, severe headache with no known cause. °? Nausea or vomiting. °? Seizure. °These symptoms may represent a serious problem that is an emergency. Do not wait to see if the symptoms will go away. Get medical help right away. Call your local emergency services (911 in the [place] Do not drive yourself to the hospital. °Summary °· Postpartum hypertension is high blood pressure that remains higher than normal after childbirth. °· In most cases, postpartum hypertension will go away on its own, usually within a week of delivery. °· For some women, medical treatment is required to prevent serious complications, such as seizures or stroke. °This information is not intended to replace advice given to you by your health care provider. Make sure you discuss any questions  you have with your health care provider. °Document Released: 06/04/2014 Document Revised: 11/07/2018 Document Reviewed: 07/22/2017 °Elsevier Patient Education © 2020 [employer] ° °

## 2019-06-26 ENCOUNTER — Telehealth: Payer: Self-pay | Admitting: *Deleted

## 2019-06-26 NOTE — Telephone Encounter (Signed)
Subjective:  Danielle Waters is a 28 y.o. female was called via telephone for a BP check.   Hypertension ROS: taking medications as instructed, no medication side effects noted, no TIA's, no chest pain on exertion, no dyspnea on exertion and no swelling of ankles.    Objective:  BP 130/86 Pt states she is feeling much better.   Assessment:   Blood Pressure improved.   Plan:  Follow up as needed and at postpartum check   Crosby Oyster, RN

## 2019-06-26 NOTE — Telephone Encounter (Signed)
Patient seen and assessed by nursing staff.  Agree with documentation and plan.  

## 2019-06-26 NOTE — Telephone Encounter (Signed)
-----   Message from Donnamae Jude, MD sent at 06/19/2019 12:57 PM EDT ----- Call and get BP check next week.

## 2019-07-10 ENCOUNTER — Inpatient Hospital Stay (HOSPITAL_COMMUNITY): Admission: AD | Admit: 2019-07-10 | Payer: BC Managed Care – PPO | Source: Home / Self Care

## 2019-07-14 ENCOUNTER — Ambulatory Visit: Payer: BC Managed Care – PPO | Admitting: Obstetrics and Gynecology

## 2019-07-20 NOTE — Progress Notes (Signed)
Post Partum Exam  Danielle Waters is a 28 y.o. [phone] female who presents for a postpartum visit. She is 5 weeks postpartum following a spontaneous vaginal delivery. I have fully reviewed the prenatal and intrapartum course. The delivery was at 36.2 gestational weeks.  Anesthesia: epidural. Postpartum course has been complicated due to HTN and swelling, had to be re admitted into hospital. Baby's course has been complicated by NICU admission. Baby is feeding by both breast and bottle - Similac Neosure. Bleeding no bleeding,. Bowel function is normal. Bladder function is normal. Patient is not sexually active. Contraception method is none. Postpartum depression screening:neg I have reviewed and independently verified the above information.  The following portions of the patient's history were reviewed and updated as appropriate: allergies, current medications, past family history, past medical history, past social history, past surgical history and problem list. Last pap smear done 01/14/2019 and was Normal  Review of Systems Pertinent items noted in HPI and remainder of comprehensive ROS otherwise negative.    Objective:  Blood pressure 118/68, pulse (!) 48, height 5\' 2"  (1.575 m), weight 189 lb (85.7 kg), unknown if currently breastfeeding.  General:  alert, cooperative and appears stated age  Lungs: normal effort  Heart:  regular rate and rhythm  Abdomen: soft, non-tender; bowel sounds normal; no masses,  no organomegaly        Assessment:    Normal postpartum exam. Pap smear not done at today's visit.   Plan:   1. Contraception: none 2. Pap due 01/2022 3. Follow up in: 3 months or as needed.  4. C/o decreased mild supply--trial of Fenu-Greek and stop taking Dyazide 5. Elevated blood pressure and pre-eclampsia--continue her Norvasc and stop her Dyazide 6. Also with complaints of BV--recurrent, no baths, no douching. Discussed ways to improve with probiotics, boric acid, yogurt.  Flagyl sent in for acute flare.

## 2019-07-21 ENCOUNTER — Ambulatory Visit (INDEPENDENT_AMBULATORY_CARE_PROVIDER_SITE_OTHER): Payer: BC Managed Care – PPO | Admitting: Family Medicine

## 2019-07-21 ENCOUNTER — Other Ambulatory Visit: Payer: Self-pay

## 2019-07-21 ENCOUNTER — Encounter: Payer: Self-pay | Admitting: Family Medicine

## 2019-07-21 VITALS — BP 118/68 | HR 48 | Ht 62.0 in | Wt 189.0 lb

## 2019-07-21 DIAGNOSIS — Z1389 Encounter for screening for other disorder: Secondary | ICD-10-CM

## 2019-07-21 DIAGNOSIS — N760 Acute vaginitis: Secondary | ICD-10-CM

## 2019-07-21 DIAGNOSIS — B9689 Other specified bacterial agents as the cause of diseases classified elsewhere: Secondary | ICD-10-CM

## 2019-07-21 DIAGNOSIS — O925 Suppressed lactation: Secondary | ICD-10-CM

## 2019-07-21 MED ORDER — METRONIDAZOLE 500 MG PO TABS: 500.0000 mg | ORAL_TABLET | Freq: Two times a day (BID) | ORAL | 0 refills | Status: DC

## 2019-08-12 ENCOUNTER — Ambulatory Visit: Payer: Self-pay

## 2019-08-12 NOTE — Lactation Note (Signed)
This note was copied from a baby's chart. Lactation Consultation Note  Patient Name: [name] Today's Date: 08/12/2019   Martinique and mom came for outpatient lactation support in efforts of rebuilding milk supply and bringing Martinique back to the breast for feedings.  Martinique was born at [redacted] weeks gestation and remained in NICU for 10 days. During Jordan's NICU stay, mom was pumping and supply breastmilk, but started to have health complications of her own. Mom was readmitted to hospital and started on lasix for treatment. Lasix depleted mom's milk supply, and due to increased stress levels at the time mom decided to discontinue pumping.  Martinique is now 2 months old, receiving formula bottles up to 4oz of 24kcal formula every 3-4 hours, and has acid reflux, and constipation concerns. Mom decided 1 week ago to resume efforts to provide breastmilk for Martinique.  Currently mom is pumping with DEBP personal breast pump every 3 hours, taking 4 capsules of 480mg  Fenugreek, and a combined supplement called Liquid Gold that has Goats rue, and milk thistle. She recently implemented a power pumping session of 110min on/71min off for 1 hour in the morning. She is sitting skin to skin with Martinique multiple times throughout the day, and notes that he will latch for 1-2 minutes calmly after being fed. Currently mom is able to pump up to 1oz combined 1-2x/day, and can easily hand express drops for encouragement of latching.  During consult today, mom was able to demonstrate Jordan's latching abilities: wide open mouth, flanged top and bottom lip, and strong rhythmic sucking for approximately 1.5 minutes before discontinuing and just resting next to mom.  LC discussed continuation of skin to skin, pumping efforts, latching as frequently as possible. Discussed paced bottle feeding technique with Martinique positioned skin to skin next to mom's breast while feeding, slowly transitioning to breast at the end of  the feed. Encouraged a second power pumping session in the PM, and the option of pump set-up and pumping briefly each time she walks past the pump. Discussed the importance of frequent stimulation and creation of hormones to begin reproducing adequate amounts of milk. Discussed milk supply and demand, timing of changes in milk production- 2-5 days after implementing a new technique. Encouraged mom to stay consistent with all efforts of pumping, skin to skin, and latching to help encourage.   Mom's goal is to breastfeed, and/or provide at least half of his nutrition through breast milk.   LC plans to follow-up with mom in 1-2 weeks to note progress, and answer any questions/concerns, or continue to assist in the process of transitioning Martinique to the breast for feeds. Next steps may be including nipple shield, and/or SNS system to aid in complete feedings at the breast.   Maternal Data    Feeding    LATCH Score                   Interventions    Lactation Tools Discussed/Used     Consult Status      Lavonia Drafts 08/12/2019, 2:18 PM

## 2019-08-20 ENCOUNTER — Telehealth: Payer: Self-pay | Admitting: Lactation Services

## 2019-08-20 NOTE — Telephone Encounter (Signed)
Patient and baby had outpatient [facility] appointment on 08/12/2019 to aid in relactation and transition to feedings at the breast. Mom had discontinued breastfeeding at day 64 of life for baby, and now baby is 32 months old and having constipation difficulties with formula, along with reflux, and mom has begun efforts of relactation. -power pumping 2x/day -pumping every 2-3 hours -Fenugreek 4x/day -Mother's milk supplement -latching at baby's willingness. LC called mom today at 1 week post outpatient appointment. Mom reports buying/fitting nipple shields that aid in baby's latching. Baby is now latching for 5 minutes at a time, mainly before feedings, becoming frustrated around 3 minute mark. Occasionally baby will latch between feeds to aid in falling asleep. Mom continues pumping and supplement efforts and has noticed an increase in her supply from 0.5oz/breast to now 1oz/breast when pumping. All EBM is given via bottle at this time. LC encouraged continued efforts, possible addition of pumping for stimulation prior to latching to aid in baby receiving milk/gratification for his efforts to encourage longer periods at the breast. Mom reminded of [facility] outpatient services for additional assistance with transition of baby back to breast if/when desired. Mom thankful for guidance and information regarding continued support.

## 2020-03-09 NOTE — Progress Notes (Deleted)
Patient is a 29 y.o. female who presents today new to the practice.  Her recent visits for medical care have been with ob/gyn in 2020, with her pregnancy complicated by HTN and preeclampsia., and admission day 3 postpartum for increased HTN and pulmonary edema. ECHO done - ok, diuresed and BP med changed from ACE-I to CCB. Last labs from that admission and reviewed. On her last postpartum visit in )ctober 2020, the dyazide was stopped, and the Norvasc was to be continued.  Other issues noted on her problem list included  Obesity  Wt Readings from Last 3 Encounters:  07/21/19 189 lb (85.7 kg)  06/19/19 191 lb 8 oz (86.9 kg)  06/13/19 208 lb (94.3 kg)   Prediabetes Lab Results  Component Value Date   HGBA1C 5.3 01/14/2019   PCOS - saw endocrine in 2016 for evaluation  HSV seropositive  Had an appy in Jan 2020. Gyn noted next PAP due 01/2022

## 2020-03-10 ENCOUNTER — Ambulatory Visit: Payer: Self-pay | Admitting: Internal Medicine

## 2020-06-01 ENCOUNTER — Ambulatory Visit: Payer: BC Managed Care – PPO | Admitting: Advanced Practice Midwife

## 2020-06-01 NOTE — Progress Notes (Deleted)
Patient did not present for her appointment today  Clayton Bibles, MSN, CNM Certified Nurse Midwife, [facility] for [employer], [facility] 06/01/20 12:43 PM

## 2020-06-11 IMAGING — DX DG CHEST 2V
2 series · 2 of 2 positions shown · non-contrast
Comparison: None.

CLINICAL DATA: Shortness of breath, recent postpartum, preeclampsia

EXAM:
CHEST - 2 VIEW

[w chest pa]
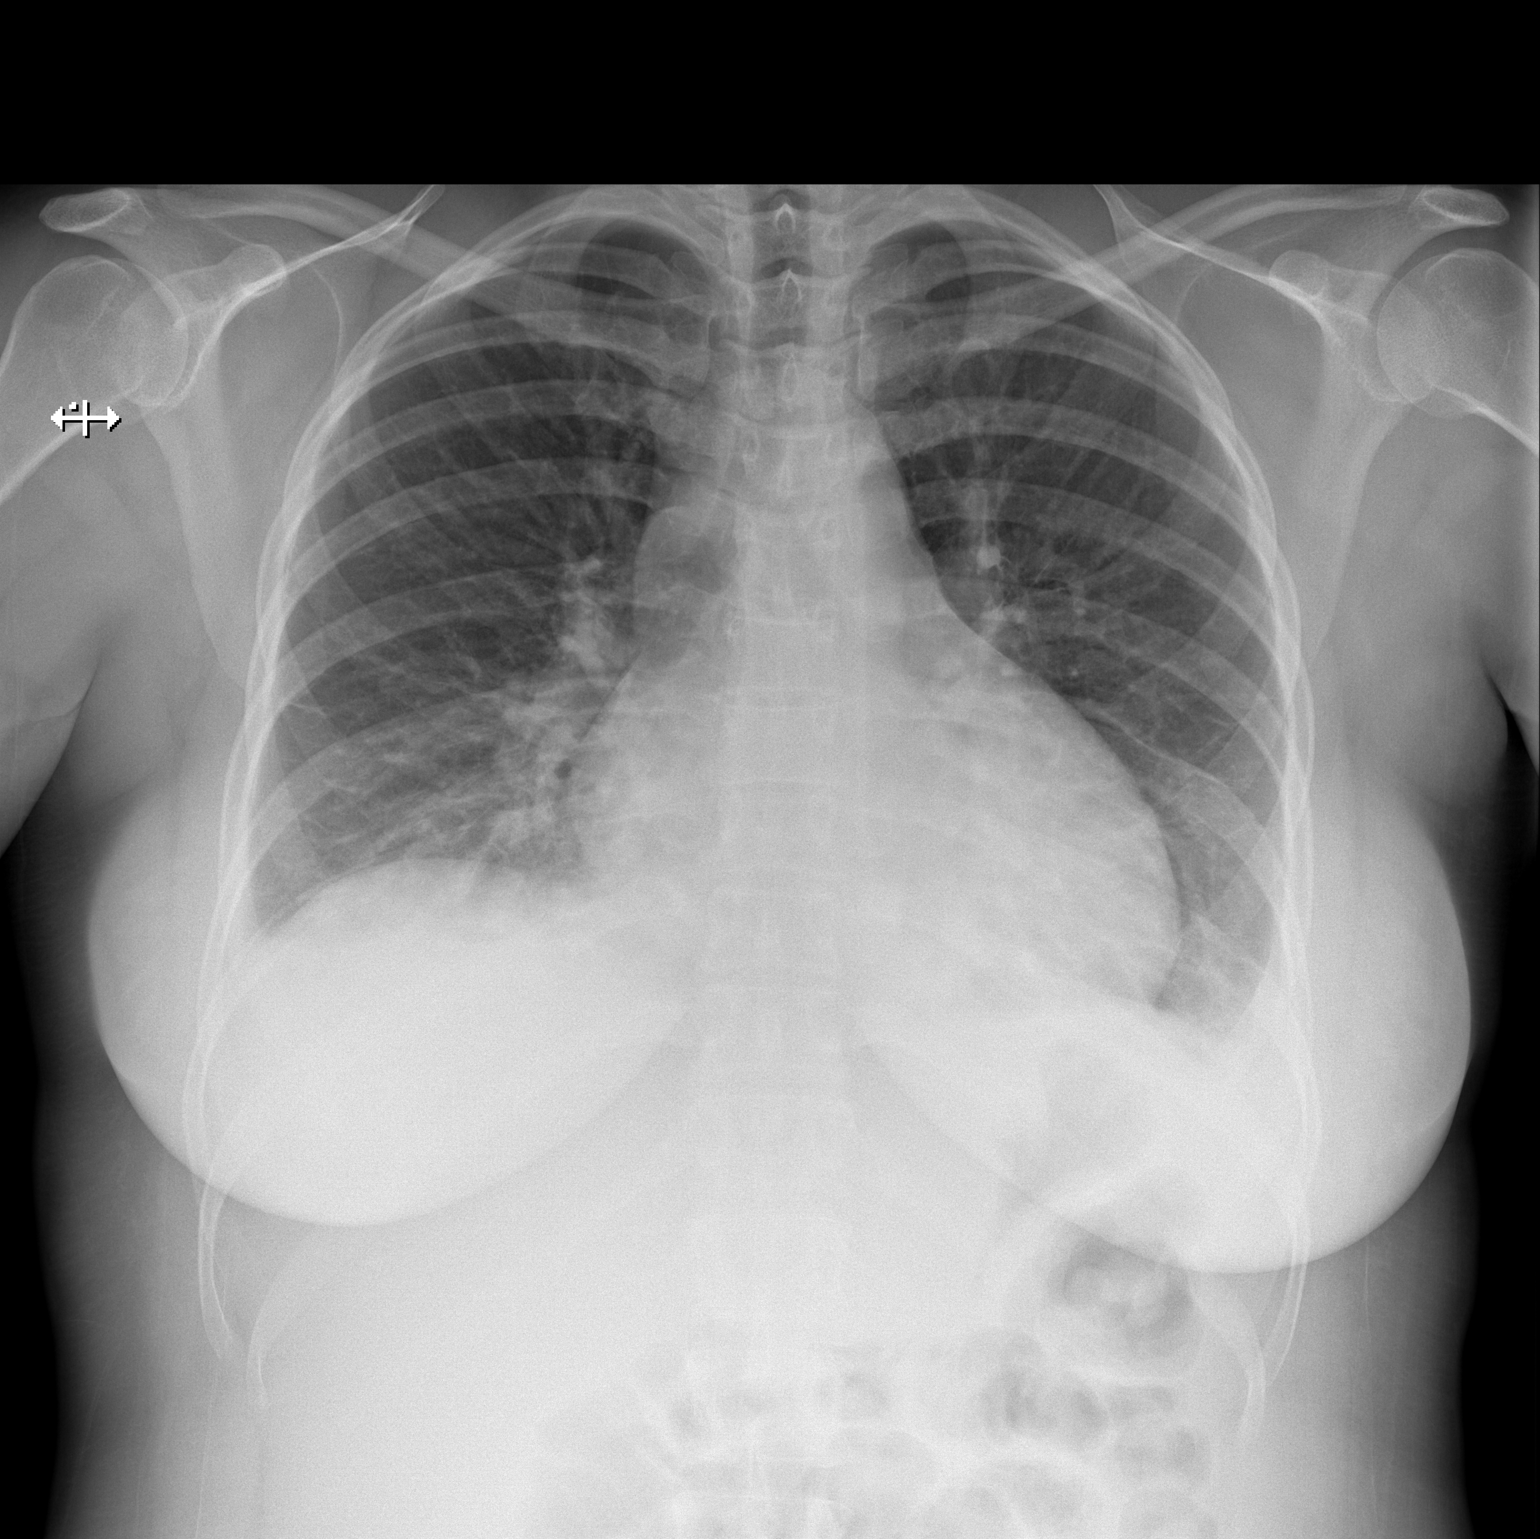

[w chest lat]
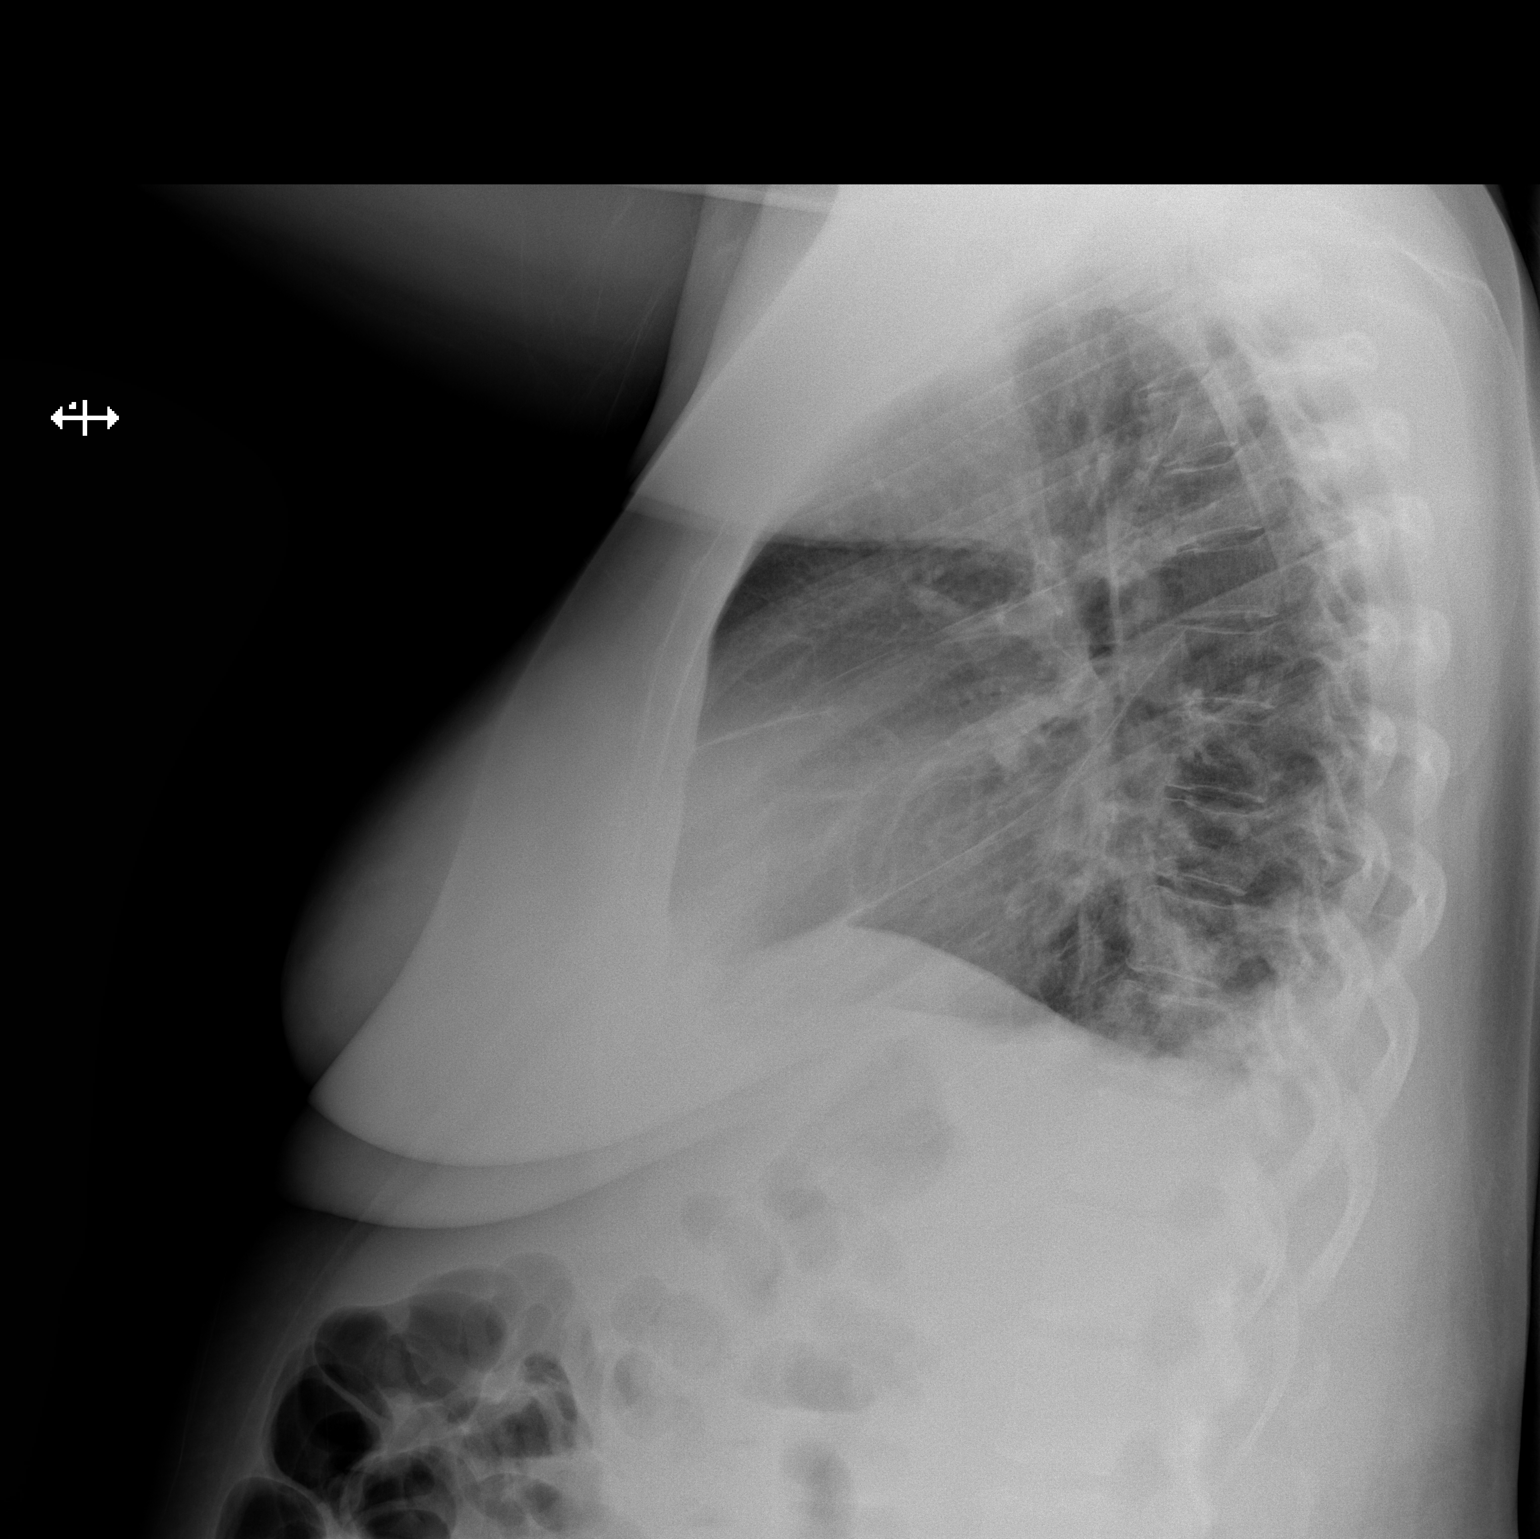

[2 of 2 positions shown; findings below may reference images not displayed]

FINDINGS: Cardiomegaly. Small bilateral pleural effusions. The visualized
skeletal structures are unremarkable.
IMPRESSION: Cardiomegaly with small bilateral pleural effusions.

## 2020-06-14 DIAGNOSIS — L819 Disorder of pigmentation, unspecified: Secondary | ICD-10-CM | POA: Diagnosis not present

## 2020-06-14 DIAGNOSIS — L910 Hypertrophic scar: Secondary | ICD-10-CM | POA: Diagnosis not present

## 2020-06-14 DIAGNOSIS — L700 Acne vulgaris: Secondary | ICD-10-CM | POA: Diagnosis not present

## 2020-11-16 ENCOUNTER — Other Ambulatory Visit (HOSPITAL_COMMUNITY)
Admission: RE | Admit: 2020-11-16 | Discharge: 2020-11-16 | Disposition: A | Discharge status: Home / Self Care | Payer: BC Managed Care – PPO | Source: Ambulatory Visit | Attending: Advanced Practice Midwife | Admitting: Advanced Practice Midwife

## 2020-11-16 ENCOUNTER — Other Ambulatory Visit: Payer: Self-pay

## 2020-11-16 ENCOUNTER — Encounter: Payer: Self-pay | Admitting: Advanced Practice Midwife

## 2020-11-16 ENCOUNTER — Ambulatory Visit (INDEPENDENT_AMBULATORY_CARE_PROVIDER_SITE_OTHER): Payer: BC Managed Care – PPO | Admitting: Advanced Practice Midwife

## 2020-11-16 VITALS — BP 121/80 | HR 69 | Ht 62.0 in | Wt 214.2 lb

## 2020-11-16 DIAGNOSIS — N760 Acute vaginitis: Secondary | ICD-10-CM | POA: Diagnosis not present

## 2020-11-16 DIAGNOSIS — E669 Obesity, unspecified: Secondary | ICD-10-CM | POA: Diagnosis not present

## 2020-11-16 DIAGNOSIS — R7303 Prediabetes: Secondary | ICD-10-CM

## 2020-11-16 DIAGNOSIS — A5901 Trichomonal vulvovaginitis: Secondary | ICD-10-CM | POA: Diagnosis not present

## 2020-11-16 DIAGNOSIS — Z01419 Encounter for gynecological examination (general) (routine) without abnormal findings: Secondary | ICD-10-CM | POA: Insufficient documentation

## 2020-11-16 DIAGNOSIS — B9689 Other specified bacterial agents as the cause of diseases classified elsewhere: Secondary | ICD-10-CM | POA: Diagnosis not present

## 2020-11-16 DIAGNOSIS — E282 Polycystic ovarian syndrome: Secondary | ICD-10-CM

## 2020-11-16 MED ORDER — METRONIDAZOLE 500 MG PO TABS: 500.0000 mg | ORAL_TABLET | Freq: Two times a day (BID) | ORAL | 0 refills | Status: DC

## 2020-11-16 NOTE — Progress Notes (Signed)
GYNECOLOGY ANNUAL PREVENTATIVE CARE ENCOUNTER NOTE  History:     Danielle Waters is a 30 y.o. [phone] female here for a routine annual gynecologic exam.  Current complaints: Patient is concerned that she is 18 months postpartum and is having difficulty losing weight. She endorsing trying multiple regimens for dieting and exercise without success. She is not breastfeeding.  Denies abnormal vaginal bleeding, discharge, pelvic pain, problems with intercourse or other gynecologic concerns.     Non-smoker, sexually active, female partners, + penetrative sex.   Gynecologic History No LMP recorded. (Menstrual status: Irregular Periods). Contraception: condoms Last Pap: 01/2019. Results were: normal with negative HPV Last mammogram: N/A age 34  Obstetric History OB History  Gravida Para Term Preterm AB Living  2 1 0 1 1 1   SAB IAB Ectopic Multiple Live Births  1 0 0 0 1    # Outcome Date GA Lbr Len/2nd Weight Sex Delivery Anes PTL Lv  2 Preterm 06/14/19 [redacted]w[redacted]d 03:11 / 00:23 5 lb 11.7 oz (2.6 kg) M Vag-Spont EPI  LIV  1 SAB 05/2018            Past Medical History:  Diagnosis Date  . Influenza A   . Medical history non-contributory   . Pregnancy induced hypertension     Past Surgical History:  Procedure Laterality Date  . APPENDECTOMY    . DILATION AND CURETTAGE OF UTERUS    . LAPAROSCOPIC APPENDECTOMY N/A 10/30/2018   Procedure: APPENDECTOMY LAPAROSCOPIC;  Surgeon: 11/01/2018, MD;  Location: [facility] ORS;  Service: General;  Laterality: N/A;    Current Outpatient Medications on File Prior to Visit  Medication Sig Dispense Refill  . ferrous sulfate (FERROUSUL) 325 (65 FE) MG tablet Take 1 tablet (325 mg total) by mouth daily with breakfast. (Patient not taking: Reported on 11/16/2020) 90 tablet 1  . ibuprofen (ADVIL) 600 MG tablet Take 1 tablet (600 mg total) by mouth every 6 (six) hours. 30 tablet 0  . NIFEdipine (ADALAT CC) 30 MG 24 hr tablet Take 1 tablet (30 mg total) by  mouth 2 (two) times daily. (Patient not taking: Reported on 11/16/2020) 60 tablet 1  . Prenatal Vit-Fe Fumarate-FA (MULTIVITAMIN-PRENATAL) 27-0.8 MG TABS tablet Take 1 tablet by mouth daily at 12 noon. (Patient not taking: Reported on 11/16/2020)    . triamterene-hydrochlorothiazide (MAXZIDE-25) 37.5-25 MG tablet Take 1 tablet by mouth daily. (Patient not taking: Reported on 11/16/2020) 30 tablet 1   No current facility-administered medications on file prior to visit.    No Known Allergies  Social History:  reports that she has never smoked. She has never used smokeless tobacco. She reports previous alcohol use. She reports that she does not use drugs.  Family History  Problem Relation Age of Onset  . Diabetes Mother   . Hypertension Mother   . Diabetes Maternal Grandmother   . Hypertension Maternal Grandmother     The following portions of the patient's history were reviewed and updated as appropriate: allergies, current medications, past family history, past medical history, past social history, past surgical history and problem list.  Review of Systems Pertinent items noted in HPI and remainder of comprehensive ROS otherwise negative.  Physical Exam:  BP 121/80   Pulse 69   Ht 5\' 2"  (1.575 m)   Wt 214 lb 3.2 oz (97.2 kg)   BMI 39.18 kg/m  CONSTITUTIONAL: Well-developed, well-nourished female in no acute distress.  HENT:  Normocephalic, atraumatic, External right and left ear normal.  EYES:  Conjunctivae and EOM are normal. Pupils are equal, round, and reactive to light. No scleral icterus.  NECK: Normal range of motion, supple, no masses.  Normal thyroid.  SKIN: Skin is warm and dry. No rash noted. Not diaphoretic. No erythema. No pallor. MUSCULOSKELETAL: Normal range of motion. No tenderness.  No cyanosis, clubbing, or edema.   NEUROLOGIC: Alert and oriented to person, place, and time. Normal reflexes, muscle tone coordination.  PSYCHIATRIC: Normal mood and affect. Normal behavior.  Normal judgment and thought content. CARDIOVASCULAR: Normal heart rate noted, regular rhythm RESPIRATORY: Clear to auscultation bilaterally. Effort and breath sounds normal, no problems with respiration noted. BREASTS: Symmetric in size. No masses, tenderness, skin changes, nipple drainage, or lymphadenopathy bilaterally. Performed in the presence of a chaperone. ABDOMEN: Soft, no distention noted.  No tenderness, rebound or guarding.  PELVIC: Normal appearing external genitalia and urethral meatus; normal appearing vaginal mucosa and cervix. Large volume of thin whitish-blue discharge noted, slightly foul odor.  Pap smear obtained.  Normal uterine size, no other palpable masses, no uterine or adnexal tenderness.  Performed in the presence of a chaperone.   Assessment and Plan:    1. Well woman exam with routine gynecological exam - No concerning findings on physical exam - Cervicovaginal ancillary only  2. Bacterial vaginosis - Treat based on physical exam - metroNIDAZOLE (FLAGYL) 500 MG tablet; Take 1 tablet (500 mg total) by mouth 2 (two) times daily.  Dispense: 14 tablet; Refill: 0  3. PCOS (polycystic ovarian syndrome)   4. Pre-diabetes - Referral to Nutrition and Diabetes Services  5. Obesity (BMI 30-39.9) - Patient endorses pre-pregnancy weight around 150-160 lbs, would like to have as goal weight - Referral to Nutrition and Diabetes Services   Routine preventative health maintenance measures emphasized. Please refer to After Visit Summary for other counseling recommendations.   Total visit time: 30 minutes. Greater than 50% of visit spent in counseling and coordination of care.  Clayton Bibles, MSN, CNM Certified Nurse Midwife, [facility] for [employer], [facility] 11/16/20 7:54 PM

## 2020-11-16 NOTE — Patient Instructions (Signed)
Preventive Care 21-32 Years Old, Female Preventive care refers to lifestyle choices and visits with your health care provider that can promote health and wellness. This includes:  A yearly physical exam. This is also called an annual wellness visit.  Regular dental and eye exams.  Immunizations.  Screening for certain conditions.  Healthy lifestyle choices, such as: ? Eating a healthy diet. ? Getting regular exercise. ? Not using drugs or products that contain nicotine and tobacco. ? Limiting alcohol use. What can I expect for my preventive care visit? Physical exam Your health care provider may check your:  Height and weight. These may be used to calculate your BMI (body mass index). BMI is a measurement that tells if you are at a healthy weight.  Heart rate and blood pressure.  Body temperature.  Skin for abnormal spots. Counseling Your health care provider may ask you questions about your:  Past medical problems.  Family's medical history.  Alcohol, tobacco, and drug use.  Emotional well-being.  Home life and relationship well-being.  Sexual activity.  Diet, exercise, and sleep habits.  Work and work environment.  Access to firearms.  Method of birth control.  Menstrual cycle.  Pregnancy history. What immunizations do I need? Vaccines are usually given at various ages, according to a schedule. Your health care provider will recommend vaccines for you based on your age, medical history, and lifestyle or other factors, such as travel or where you work.   What tests do I need? Blood tests  Lipid and cholesterol levels. These may be checked every 5 years starting at age 20.  Hepatitis C test.  Hepatitis B test. Screening  Diabetes screening. This is done by checking your blood sugar (glucose) after you have not eaten for a while (fasting).  STD (sexually transmitted disease) testing, if you are at risk.  BRCA-related cancer screening. This may be  done if you have a family history of breast, ovarian, tubal, or peritoneal cancers.  Pelvic exam and Pap test. This may be done every 3 years starting at age 21. Starting at age 30, this may be done every 5 years if you have a Pap test in combination with an HPV test. Talk with your health care provider about your test results, treatment options, and if necessary, the need for more tests.   Follow these instructions at home: Eating and drinking  Eat a healthy diet that includes fresh fruits and vegetables, whole grains, lean protein, and low-fat dairy products.  Take vitamin and mineral supplements as recommended by your health care provider.  Do not drink alcohol if: ? Your health care provider tells you not to drink. ? You are pregnant, may be pregnant, or are planning to become pregnant.  If you drink alcohol: ? Limit how much you have to 0-1 drink a day. ? Be aware of how much alcohol is in your drink. In the [place] one drink equals one 12 oz bottle of beer (355 mL), one 5 oz glass of wine (148 mL), or one 1 oz glass of hard liquor (44 mL).   Lifestyle  Take daily care of your teeth and gums. Brush your teeth every morning and night with fluoride toothpaste. Floss one time each day.  Stay active. Exercise for at least 30 minutes 5 or more days each week.  Do not use any products that contain nicotine or tobacco, such as cigarettes, e-cigarettes, and chewing tobacco. If you need help quitting, ask your health care provider.  Do not   use drugs.  If you are sexually active, practice safe sex. Use a condom or other form of protection to prevent STIs (sexually transmitted infections).  If you do not wish to become pregnant, use a form of birth control. If you plan to become pregnant, see your health care provider for a prepregnancy visit.  Find healthy ways to cope with stress, such as: ? Meditation, yoga, or listening to music. ? Journaling. ? Talking to a trusted  person. ? Spending time with friends and family. Safety  Always wear your seat belt while driving or riding in a vehicle.  Do not drive: ? If you have been drinking alcohol. Do not ride with someone who has been drinking. ? When you are tired or distracted. ? While texting.  Wear a helmet and other protective equipment during sports activities.  If you have firearms in your house, make sure you follow all gun safety procedures.  Seek help if you have been physically or sexually abused. What's next?  Go to your health care provider once a year for an annual wellness visit.  Ask your health care provider how often you should have your eyes and teeth checked.  Stay up to date on all vaccines. This information is not intended to replace advice given to you by your health care provider. Make sure you discuss any questions you have with your health care provider. Document Revised: 05/29/2020 Document Reviewed: 06/12/2018 Elsevier Patient Education  2021 Elsevier Inc.  

## 2020-11-20 LAB — CERVICOVAGINAL ANCILLARY ONLY
Bacterial Vaginitis (gardnerella): POSITIVE — AB
Candida Glabrata: NEGATIVE
Candida Vaginitis: NEGATIVE
Chlamydia: NEGATIVE
Comment: NEGATIVE
Comment: NEGATIVE
Comment: NEGATIVE
Comment: NEGATIVE
Comment: NEGATIVE
Comment: NORMAL
Neisseria Gonorrhea: NEGATIVE
Trichomonas: POSITIVE — AB

## 2020-11-21 ENCOUNTER — Telehealth: Payer: Self-pay | Admitting: *Deleted

## 2020-11-21 NOTE — Telephone Encounter (Signed)
Called pt to verify she did see her results via mychart. Pt has and flagyl was given at appt. Advised pt to inform partner(s) so that they can be tested and treated  and to refrain from any unprotected sex until at least 7-10 days after treatment and she can come in for [facility] in about 4 weeks Pt verbalizes and understands.

## 2020-11-29 ENCOUNTER — Telehealth: Payer: Self-pay | Admitting: *Deleted

## 2020-11-29 MED ORDER — FLUCONAZOLE 150 MG PO TABS: 150.0000 mg | ORAL_TABLET | Freq: Once | ORAL | 1 refills | Status: AC

## 2020-11-29 NOTE — Telephone Encounter (Signed)
Pt called and states she finished her flagyl and now has  Some vaginal itching. Informed that most likely yeast from flagyl and will send in diflucan to her pharmacy.

## 2021-01-04 ENCOUNTER — Ambulatory Visit: Payer: BC Managed Care – PPO | Admitting: Dietician

## 2021-01-26 ENCOUNTER — Other Ambulatory Visit (HOSPITAL_COMMUNITY)
Admission: RE | Admit: 2021-01-26 | Discharge: 2021-01-26 | Disposition: A | Discharge status: Home / Self Care | Payer: BC Managed Care – PPO | Source: Ambulatory Visit | Attending: Obstetrics & Gynecology | Admitting: Obstetrics & Gynecology

## 2021-01-26 ENCOUNTER — Ambulatory Visit: Payer: BC Managed Care – PPO

## 2021-01-26 ENCOUNTER — Other Ambulatory Visit: Payer: Self-pay

## 2021-01-26 ENCOUNTER — Ambulatory Visit (INDEPENDENT_AMBULATORY_CARE_PROVIDER_SITE_OTHER): Payer: BC Managed Care – PPO | Admitting: *Deleted

## 2021-01-26 VITALS — BP 135/82 | HR 56

## 2021-01-26 DIAGNOSIS — A599 Trichomoniasis, unspecified: Secondary | ICD-10-CM

## 2021-01-26 DIAGNOSIS — R8290 Unspecified abnormal findings in urine: Secondary | ICD-10-CM | POA: Diagnosis not present

## 2021-01-26 LAB — POCT URINALYSIS DIPSTICK: Leukocytes, UA: NEGATIVE

## 2021-01-26 NOTE — Progress Notes (Signed)
Patient was assessed and managed by nursing staff during this encounter. I have reviewed the chart and agree with the documentation and plan. I have also made any necessary editorial changes.  Jaynie Collins, MD 01/26/2021 12:04 PM

## 2021-01-26 NOTE — Progress Notes (Signed)
SUBJECTIVE:  30 y.o. female here for TOC from her previous trichomonas infection. Pt completed her treatment. Pt also wanted [place] to check her urine due to it being cloudy. Denies abnormal vaginal bleeding or significant pelvic pain or fever. No UTI symptoms. Denies history of known exposure to STD.  No LMP recorded. (Menstrual status: Irregular Periods).  OBJECTIVE:  She appears well, afebrile. Urine dipstick: positive for RBC's.  ASSESSMENT:  TOC for trichomonas Cloudy urine   PLAN:  trichomonas probe and urine culture sent to lab. Treatment: To be determined once lab results are received ROV prn if symptoms persist or worsen.

## 2021-01-27 LAB — CERVICOVAGINAL ANCILLARY ONLY
Comment: NEGATIVE
Trichomonas: NEGATIVE

## 2021-01-28 LAB — URINE CULTURE

## 2021-10-10 ENCOUNTER — Encounter: Payer: Self-pay | Admitting: Radiology

## 2021-11-12 DIAGNOSIS — S0990XA Unspecified injury of head, initial encounter: Secondary | ICD-10-CM | POA: Diagnosis not present

## 2021-11-12 DIAGNOSIS — Z6835 Body mass index (BMI) 35.0-35.9, adult: Secondary | ICD-10-CM | POA: Diagnosis not present

## 2021-11-12 DIAGNOSIS — R072 Precordial pain: Secondary | ICD-10-CM | POA: Diagnosis not present

## 2021-11-12 DIAGNOSIS — R519 Headache, unspecified: Secondary | ICD-10-CM | POA: Diagnosis not present

## 2021-11-12 DIAGNOSIS — R0781 Pleurodynia: Secondary | ICD-10-CM | POA: Diagnosis not present

## 2021-11-14 ENCOUNTER — Telehealth: Payer: Self-pay

## 2021-11-14 NOTE — Telephone Encounter (Signed)
Transition Care Management Unsuccessful Follow-up Telephone Call  Date of discharge and from where:  11/13/2021-[facility]   Attempts:  1st Attempt  Reason for unsuccessful TCM follow-up call:  Left voice message

## 2021-11-15 NOTE — Telephone Encounter (Signed)
Transition Care Management Unsuccessful Follow-up Telephone Call  Date of discharge and from where:  11/13/2021-[facility]  Attempts:  2nd Attempt  Reason for unsuccessful TCM follow-up call:  Left voice message

## 2021-11-16 NOTE — Telephone Encounter (Signed)
Transition Care Management Unsuccessful Follow-up Telephone Call  Date of discharge and from where:  11/13/2021-[facility]  Attempts:  3rd Attempt  Reason for unsuccessful TCM follow-up call:  Voice mail full

## 2021-12-27 DIAGNOSIS — M545 Low back pain, unspecified: Secondary | ICD-10-CM | POA: Diagnosis not present

## 2021-12-29 DIAGNOSIS — M545 Low back pain, unspecified: Secondary | ICD-10-CM | POA: Diagnosis not present

## 2022-03-07 ENCOUNTER — Other Ambulatory Visit (HOSPITAL_COMMUNITY)
Admission: RE | Admit: 2022-03-07 | Discharge: 2022-03-07 | Disposition: A | Discharge status: Home / Self Care | Payer: BC Managed Care – PPO | Source: Ambulatory Visit | Attending: Family Medicine | Admitting: Family Medicine

## 2022-03-07 ENCOUNTER — Ambulatory Visit (INDEPENDENT_AMBULATORY_CARE_PROVIDER_SITE_OTHER): Payer: BC Managed Care – PPO | Admitting: *Deleted

## 2022-03-07 VITALS — BP 138/85 | HR 49

## 2022-03-07 DIAGNOSIS — N898 Other specified noninflammatory disorders of vagina: Secondary | ICD-10-CM

## 2022-03-07 NOTE — Progress Notes (Signed)
Patient seen and assessed by nursing staff.  Agree with documentation and plan.  

## 2022-03-07 NOTE — Progress Notes (Cosign Needed)
SUBJECTIVE:  31 y.o. female complains of vaginal itching for 3 day(s). Denies abnormal vaginal bleeding or significant pelvic pain or fever. No UTI symptoms. Denies history of known exposure to STD.  No LMP recorded. (Menstrual status: Irregular Periods).  OBJECTIVE:  She appears well, afebrile. Urine dipstick: not done.  ASSESSMENT:  Vaginal itching  STI screen    PLAN:  GC, chlamydia, trichomonas, BVAG, CVAG probe sent to lab. Treatment: To be determined once lab results are received ROV prn if symptoms persist or worsen.

## 2022-03-08 LAB — CERVICOVAGINAL ANCILLARY ONLY
Bacterial Vaginitis (gardnerella): POSITIVE — AB
Candida Glabrata: NEGATIVE
Candida Vaginitis: NEGATIVE
Chlamydia: NEGATIVE
Comment: NEGATIVE
Comment: NEGATIVE
Comment: NEGATIVE
Comment: NEGATIVE
Comment: NEGATIVE
Comment: NORMAL
Neisseria Gonorrhea: NEGATIVE
Trichomonas: NEGATIVE

## 2022-03-09 MED ORDER — METRONIDAZOLE 500 MG PO TABS: 500.0000 mg | ORAL_TABLET | Freq: Two times a day (BID) | ORAL | 0 refills | Status: DC

## 2022-03-09 NOTE — Addendum Note (Signed)
Addended by: Reva Bores on: 03/09/2022 10:48 AM   Modules accepted: Orders

## 2022-03-09 NOTE — Progress Notes (Signed)
Findings c/w BV--rx sint in

## 2022-03-16 ENCOUNTER — Other Ambulatory Visit: Payer: Self-pay

## 2022-03-16 DIAGNOSIS — N898 Other specified noninflammatory disorders of vagina: Secondary | ICD-10-CM

## 2022-03-16 MED ORDER — FLUCONAZOLE 150 MG PO TABS: 150.0000 mg | ORAL_TABLET | Freq: Once | ORAL | 3 refills | Status: AC

## 2022-03-16 NOTE — Progress Notes (Signed)
Pt requesting Rx for yeast after completing BV treatment  Diflucan sent per protocol.  Pt made aware and voiced understanding.

## 2022-03-27 ENCOUNTER — Ambulatory Visit: Payer: BC Managed Care – PPO | Admitting: Obstetrics and Gynecology

## 2022-03-30 ENCOUNTER — Other Ambulatory Visit: Payer: Self-pay

## 2022-03-30 DIAGNOSIS — N898 Other specified noninflammatory disorders of vagina: Secondary | ICD-10-CM

## 2022-03-30 MED ORDER — FLUCONAZOLE 150 MG PO TABS: 150.0000 mg | ORAL_TABLET | Freq: Once | ORAL | 0 refills | Status: AC

## 2022-03-30 MED ORDER — METRONIDAZOLE 500 MG PO TABS: 500.0000 mg | ORAL_TABLET | Freq: Two times a day (BID) | ORAL | 0 refills | Status: AC

## 2022-03-30 NOTE — Progress Notes (Signed)
Rx sent per protocol for BV Pt advised she will need to come in if any other sx's return after treatment is completed  Pt also advised she needs her annual aex. Pt agreeable and voiced understanding.

## 2022-04-11 ENCOUNTER — Ambulatory Visit
Admission: EM | Admit: 2022-04-11 | Discharge: 2022-04-11 | Disposition: A | Discharge status: Home / Self Care | Payer: BC Managed Care – PPO | Attending: Physician Assistant | Admitting: Physician Assistant

## 2022-04-11 ENCOUNTER — Other Ambulatory Visit: Payer: Self-pay

## 2022-04-11 DIAGNOSIS — R220 Localized swelling, mass and lump, head: Secondary | ICD-10-CM | POA: Diagnosis not present

## 2022-04-11 DIAGNOSIS — T7840XA Allergy, unspecified, initial encounter: Secondary | ICD-10-CM

## 2022-04-11 DIAGNOSIS — L509 Urticaria, unspecified: Secondary | ICD-10-CM | POA: Diagnosis not present

## 2022-04-11 MED ORDER — PREDNISONE 10 MG PO TABS: ORAL_TABLET | ORAL | 0 refills | Status: DC

## 2022-04-11 MED ORDER — FAMOTIDINE 20 MG PO TABS
20.0000 mg | ORAL_TABLET | Freq: Once | ORAL | Status: AC
  Administered 2022-04-11: 20 mg via ORAL

## 2022-04-11 NOTE — ED Triage Notes (Signed)
Pt C/O of possible allergic reaction to Metronidazole that she started 6 days ago. She stated that 2 nights ago she started breaking out in hives on both arms and legs. This morning she woke up with swollen lips and hands. She states that the last dose of Metronidazole she took was two mornings ago on 6/26. She stated that she took 2 tablets of Benadryl (50 MG) at 10:30 last night.

## 2022-04-11 NOTE — ED Provider Notes (Signed)
[facility]    CSN: [id number] Arrival date & time: 04/11/22  0805      History   Chief Complaint Chief Complaint  Patient presents with   Allergic Reaction    HPI Danielle Waters is a 31 y.o. female presenting for possible allergic reaction to metronidazole.  Patient reports that she took the medication for 4 days for BV infection.  Reports at that time she started to develop hives on her extremities.  She says she has taken 2 Benadryl last night and the night before.  Reports she also stopped the metronidazole when she developed a rash.  She woke up today with swollen lips and hands.  She cannot think of any other potential cause for her reaction.  She has no known allergies.  She has not taken any other new medicines.  She has tolerated metronidazole in the past.  She denies any new foods, topical products, detergents.  She denies any associated throat tightness, voice changes, breathing difficulty, chest tightness.  HPI  Past Medical History:  Diagnosis Date   Influenza A    Medical history non-contributory    Pregnancy induced hypertension     Patient Active Problem List   Diagnosis Date Noted   Pulmonary edema 06/17/2019   History of pre-eclampsia 06/13/2019   HSV-2 seropositive 02/19/2019   Herpes exposure 01/19/2019   Pre-diabetes 09/04/2017   PCOS (polycystic ovarian syndrome) 08/28/2017   Obesity (BMI 30-39.9) 08/26/2017    Past Surgical History:  Procedure Laterality Date   APPENDECTOMY     DILATION AND CURETTAGE OF UTERUS     LAPAROSCOPIC APPENDECTOMY N/A 10/30/2018   Procedure: APPENDECTOMY LAPAROSCOPIC;  Surgeon: Henrene Dodge, MD;  Location: [facility] ORS;  Service: General;  Laterality: N/A;    OB History     Gravida  2   Para  1   Term  0   Preterm  1   AB  1   Living  1      SAB  1   IAB  0   Ectopic  0   Multiple  0   Live Births  1            Home Medications    Prior to Admission medications    Medication Sig Start Date End Date Taking? Authorizing Provider  predniSONE (DELTASONE) 10 MG tablet Take 6 tabs p.o. on day 1 and decrease by 1 tablet daily until complete 04/11/22  Yes Eusebio Friendly B, PA-C  ferrous sulfate (FERROUSUL) 325 (65 FE) MG tablet Take 1 tablet (325 mg total) by mouth daily with breakfast. Patient not taking: Reported on 11/16/2020 06/19/19   Reva Bores, MD  ibuprofen (ADVIL) 600 MG tablet Take 1 tablet (600 mg total) by mouth every 6 (six) hours. 06/16/19   Reva Bores, MD  NIFEdipine (ADALAT CC) 30 MG 24 hr tablet Take 1 tablet (30 mg total) by mouth 2 (two) times daily. Patient not taking: Reported on 11/16/2020 06/19/19   Reva Bores, MD  Prenatal Vit-Fe Fumarate-FA (MULTIVITAMIN-PRENATAL) 27-0.8 MG TABS tablet Take 1 tablet by mouth daily at 12 noon. Patient not taking: Reported on 11/16/2020    [provider]  triamterene-hydrochlorothiazide (MAXZIDE-25) 37.5-25 MG tablet Take 1 tablet by mouth daily. Patient not taking: Reported on 11/16/2020 06/20/19   Reva Bores, MD    Family History Family History  Problem Relation Age of Onset   Diabetes Mother    Hypertension Mother    Diabetes Maternal  Grandmother    Hypertension Maternal Grandmother     Social History Social History   Tobacco Use   Smoking status: Never   Smokeless tobacco: Never  Vaping Use   Vaping Use: Never used  Substance Use Topics   Alcohol use: Not Currently   Drug use: No     Allergies   Flagyl [metronidazole]   Review of Systems Review of Systems  Constitutional:  Negative for fatigue and fever.  HENT:  Positive for facial swelling. Negative for congestion, trouble swallowing and voice change.   Respiratory:  Negative for cough, shortness of breath and wheezing.   Gastrointestinal:  Negative for nausea and vomiting.  Skin:  Positive for rash.  Neurological:  Negative for dizziness, weakness and headaches.     Physical Exam Triage Vital Signs ED Triage  Vitals  Enc Vitals Group     BP      Pulse      Resp      Temp      Temp src      SpO2      Weight      Height      Head Circumference      Peak Flow      Pain Score      Pain Loc      Pain Edu?      Excl. in GC?    No data found.  Updated Vital Signs BP 110/83 (BP Location: Right Arm)   Pulse (!) 58   Temp 98.3 F (36.8 C) (Oral)   Resp 15   Ht 5\' 3"  (1.6 m)   Wt 201 lb (91.2 kg)   LMP 04/07/2022   SpO2 100%   BMI 35.61 kg/m      Physical Exam Vitals and nursing note reviewed.  Constitutional:      General: She is not in acute distress.    Appearance: Normal appearance. She is not ill-appearing or toxic-appearing.  HENT:     Head: Normocephalic and atraumatic.     Nose: Nose normal.     Mouth/Throat:     Mouth: Mucous membranes are moist.     Pharynx: Oropharynx is clear.     Comments: Mild swelling of upper and lower lips.  No intraoral swelling. Eyes:     General: No scleral icterus.       Right eye: No discharge.        Left eye: No discharge.     Conjunctiva/sclera: Conjunctivae normal.  Cardiovascular:     Rate and Rhythm: Regular rhythm. Bradycardia present.     Heart sounds: Normal heart sounds.  Pulmonary:     Effort: Pulmonary effort is normal. No respiratory distress.     Breath sounds: Normal breath sounds.  Musculoskeletal:     Cervical back: Neck supple.  Skin:    General: Skin is dry.     Findings: Rash (erythematous papules of bilateral arms, legs, torso) present.     Comments: Mild diffuse swelling of bilateral hands.  Neurological:     General: No focal deficit present.     Mental Status: She is alert. Mental status is at baseline.     Motor: No weakness.     Gait: Gait normal.  Psychiatric:        Mood and Affect: Mood normal.        Behavior: Behavior normal.        Thought Content: Thought content normal.      UC Treatments / Results  Labs (all labs ordered are listed, but only abnormal results are displayed) Labs  Reviewed - No data to display  EKG   Radiology No results found.  Procedures Procedures (including critical care time)  Medications Ordered in UC Medications  famotidine (PEPCID) tablet 20 mg (has no administration in time range)    Initial Impression / Assessment and Plan / UC Course  I have reviewed the triage vital signs and the nursing notes.  Pertinent labs & imaging results that were available during my care of the patient were reviewed by me and considered in my medical decision making (see chart for details).  31 year old female presenting for allergic reaction.  Reports she developed hives 2 days ago when she was 4 days into taking oral metronidazole for BV infection.  Reports she has taken a couple Benadryl the last 2 nights but has woken up today with swollen lips and hands.  Cannot identify any other potential triggers.  No breathing difficulty or chest tightness, throat tightness.  No known allergies.  Vitals are stable.  She is overall well-appearing.  She does have mild swelling of her lips and hands.  She also has hives like rash of upper and lower extremities as well as torso.  No intraoral swelling.  Chest clear to auscultation.  Patient requests nondrowsy antihistamine in clinic.  Patient given 20 mg famotidine.  Also advised that she continue this medication as well as nondrowsy Claritin and Benadryl at home.  Sent prednisone taper as well.  Listed metronidazole as an allergy.  She is denying any continued BV symptoms.  Advised if she does develop recurrent BV symptoms she should be seen and reevaluated if she will need a different medication.  Reviewed ED precautions.  Work note provided.   Final Clinical Impressions(s) / UC Diagnoses   Final diagnoses:  Hives  Lip swelling  Allergic reaction, initial encounter     Discharge Instructions      -You are likely having an allergic reaction to metronidazole.  We did add this to your chart.  Avoid this medicine  in the future.  If you have recurrent BV symptoms you should be retested and treated again with a different medicine. - I have sent prednisone taper to the pharmacy.  Start this immediately.  You should also take nondrowsy Claritin during the day as well as Pepcid and if you cannot tolerate 1-2 Benadryl every 4-6 hours you may take 1-2 tabs every 8 hours as able. - Apply ice to your hands and lips to help with swelling. - Call 911 or go to ER if you have any sudden acute worsening of your swelling or any associated breathing difficulty.     ED Prescriptions     Medication Sig Dispense Auth. Provider   predniSONE (DELTASONE) 10 MG tablet Take 6 tabs p.o. on day 1 and decrease by 1 tablet daily until complete 21 tablet Shirlee Latch, PA-C      PDMP not reviewed this encounter.   Shirlee Latch, PA-C 04/11/22 [phone]

## 2022-04-11 NOTE — Discharge Instructions (Addendum)
-  You are likely having an allergic reaction to metronidazole.  We did add this to your chart.  Avoid this medicine in the future.  If you have recurrent BV symptoms you should be retested and treated again with a different medicine. - I have sent prednisone taper to the pharmacy.  Start this immediately.  You should also take nondrowsy Claritin during the day as well as Pepcid and if you cannot tolerate 1-2 Benadryl every 4-6 hours you may take 1-2 tabs every 8 hours as able. - Apply ice to your hands and lips to help with swelling. - Call 911 or go to ER if you have any sudden acute worsening of your swelling or any associated breathing difficulty.

## 2022-05-06 ENCOUNTER — Other Ambulatory Visit: Payer: Self-pay

## 2022-05-06 ENCOUNTER — Emergency Department
Admission: EM | Admit: 2022-05-06 | Discharge: 2022-05-06 | Disposition: A | Discharge status: Home / Self Care | Payer: BC Managed Care – PPO | Attending: Emergency Medicine | Admitting: Emergency Medicine

## 2022-05-06 ENCOUNTER — Emergency Department: Payer: BC Managed Care – PPO

## 2022-05-06 DIAGNOSIS — B349 Viral infection, unspecified: Secondary | ICD-10-CM

## 2022-05-06 DIAGNOSIS — J029 Acute pharyngitis, unspecified: Secondary | ICD-10-CM

## 2022-05-06 DIAGNOSIS — Z20822 Contact with and (suspected) exposure to covid-19: Secondary | ICD-10-CM | POA: Insufficient documentation

## 2022-05-06 DIAGNOSIS — R0789 Other chest pain: Secondary | ICD-10-CM | POA: Diagnosis not present

## 2022-05-06 DIAGNOSIS — R8289 Other abnormal findings on cytological and histological examination of urine: Secondary | ICD-10-CM | POA: Insufficient documentation

## 2022-05-06 DIAGNOSIS — B9789 Other viral agents as the cause of diseases classified elsewhere: Secondary | ICD-10-CM | POA: Diagnosis not present

## 2022-05-06 DIAGNOSIS — R509 Fever, unspecified: Secondary | ICD-10-CM | POA: Diagnosis present

## 2022-05-06 DIAGNOSIS — R079 Chest pain, unspecified: Secondary | ICD-10-CM | POA: Diagnosis not present

## 2022-05-06 DIAGNOSIS — J028 Acute pharyngitis due to other specified organisms: Secondary | ICD-10-CM | POA: Insufficient documentation

## 2022-05-06 LAB — GROUP A STREP BY PCR: Group A Strep by PCR: NOT DETECTED

## 2022-05-06 LAB — COMPREHENSIVE METABOLIC PANEL
ALT: 24 U/L (ref 0–44)
AST: 21 U/L (ref 15–41)
Albumin: 4 g/dL (ref 3.5–5.0)
Alkaline Phosphatase: 57 U/L (ref 38–126)
Anion gap: 8 (ref 5–15)
BUN: 10 mg/dL (ref 6–20)
CO2: 25 mmol/L (ref 22–32)
Calcium: 9.4 mg/dL (ref 8.9–10.3)
Chloride: 103 mmol/L (ref 98–111)
Creatinine, Ser: 0.85 mg/dL (ref 0.44–1.00)
GFR, Estimated: >60 mL/min (ref >60)
Glucose, Bld: 118 mg/dL — ABNORMAL HIGH (ref 70–99)
Potassium: 3.9 mmol/L (ref 3.5–5.1)
Sodium: 136 mmol/L (ref 135–145)
Total Bilirubin: 0.5 mg/dL (ref 0.3–1.2)
Total Protein: 7.9 g/dL (ref 6.5–8.1)

## 2022-05-06 LAB — CBC WITH DIFFERENTIAL/PLATELET
Abs Immature Granulocytes: 0.08 10*3/uL — ABNORMAL HIGH (ref 0.00–0.07)
Basophils Absolute: 0 10*3/uL (ref 0.0–0.1)
Basophils Relative: 0 %
Eosinophils Absolute: 0 10*3/uL (ref 0.0–0.5)
Eosinophils Relative: 0 %
HCT: 38.4 % (ref 36.0–46.0)
Hemoglobin: 12.8 g/dL (ref 12.0–15.0)
Immature Granulocytes: 1 %
Lymphocytes Relative: 6 %
Lymphs Abs: 1 10*3/uL (ref 0.7–4.0)
MCH: 32.6 pg (ref 26.0–34.0)
MCHC: 33.3 g/dL (ref 30.0–36.0)
MCV: 97.7 fL (ref 80.0–100.0)
Monocytes Absolute: 0.6 10*3/uL (ref 0.1–1.0)
Monocytes Relative: 4 %
Neutro Abs: 14.6 10*3/uL — ABNORMAL HIGH (ref 1.7–7.7)
Neutrophils Relative %: 89 %
Platelets: 286 10*3/uL (ref 150–400)
RBC: 3.93 MIL/uL (ref 3.87–5.11)
RDW: 11.9 % (ref 11.5–15.5)
WBC: 16.3 10*3/uL — ABNORMAL HIGH (ref 4.0–10.5)
nRBC: 0 % (ref 0.0–0.2)

## 2022-05-06 LAB — URINALYSIS, ROUTINE W REFLEX MICROSCOPIC
Bilirubin Urine: NEGATIVE
Glucose, UA: NEGATIVE mg/dL
Ketones, ur: NEGATIVE mg/dL
Leukocytes,Ua: NEGATIVE
Nitrite: NEGATIVE
Protein, ur: NEGATIVE mg/dL
Specific Gravity, Urine: 1.017 (ref 1.005–1.030)
pH: 6 (ref 5.0–8.0)

## 2022-05-06 LAB — RESP PANEL BY RT-PCR (FLU A&B, COVID) ARPGX2
Influenza A by PCR: NEGATIVE
Influenza B by PCR: NEGATIVE
SARS Coronavirus 2 by RT PCR: NEGATIVE

## 2022-05-06 LAB — TROPONIN I (HIGH SENSITIVITY): Troponin I (High Sensitivity): 4 ng/L (ref <18)

## 2022-05-06 LAB — POC URINE PREG, ED: Preg Test, Ur: NEGATIVE

## 2022-05-06 MED ORDER — ACETAMINOPHEN 325 MG PO TABS
650.0000 mg | ORAL_TABLET | Freq: Once | ORAL | Status: AC | PRN
  Administered 2022-05-06: 650 mg via ORAL

## 2022-05-06 MED ORDER — ACETAMINOPHEN 325 MG PO TABS
ORAL_TABLET | ORAL | Status: AC
  Filled 2022-05-06: qty 2

## 2022-05-06 MED ORDER — AMOXICILLIN-POT CLAVULANATE 875-125 MG PO TABS: 1.0000 | ORAL_TABLET | Freq: Two times a day (BID) | ORAL | 0 refills | Status: DC

## 2022-05-06 MED ORDER — ONDANSETRON 4 MG PO TBDP: 4.0000 mg | ORAL_TABLET | Freq: Three times a day (TID) | ORAL | 0 refills | Status: AC | PRN

## 2022-05-06 NOTE — ED Provider Notes (Signed)
[facility] Provider Note    Event Date/Time   First MD Initiated Contact with Patient 05/06/22 [phone]     (approximate)   History   Sore Throat   HPI  Danielle Waters is a 31 y.o. female   presents to the ED with complaint of body aches, sore throat, nausea and a fever as high as 101 this started 2 days ago.  No one else in her family is sick at this time and she is unaware of any known contacts.  Patient also reports some dark urine but no dysuria.      Physical Exam   Triage Vital Signs: ED Triage Vitals  Enc Vitals Group     BP 05/06/22 0618 130/74     Pulse Rate 05/06/22 0618 87     Resp 05/06/22 0618 15     Temp 05/06/22 0618 (!) 101.6 F (38.7 C)     Temp Source 05/06/22 0618 Oral     SpO2 05/06/22 0618 96 %     Weight 05/06/22 0618 205 lb (93 kg)     Height 05/06/22 0618 5\' 3"  (1.6 m)     Head Circumference --      Peak Flow --      Pain Score 05/06/22 0618 0     Pain Loc --      Pain Edu? --      Excl. in GC? --     Most recent vital signs: Vitals:   05/06/22 0800 05/06/22 0952  BP: 132/70 130/70  Pulse: 80 78  Resp: 16 16  Temp: 99.9 F (37.7 C) 98.7 F (37.1 C)  SpO2: 96% 96%     General: Awake, no distress.  CV:  Good peripheral perfusion.  Heart regular rate and rhythm. Resp:  Normal effort.  Lungs are clear bilaterally. Abd:  No distention.  Other:  Posterior pharynx mildly injected but no erythema, tonsillar enlargement or exudate present.  Neck supple without cervical lymphadenopathy.   ED Results / Procedures / Treatments   Labs (all labs ordered are listed, but only abnormal results are displayed) Labs Reviewed  CBC WITH DIFFERENTIAL/PLATELET - Abnormal; Notable for the following components:      Result Value   WBC 16.3 (*)    Neutro Abs 14.6 (*)    Abs Immature Granulocytes 0.08 (*)    All other components within normal limits  COMPREHENSIVE METABOLIC PANEL - Abnormal; Notable for the following  components:   Glucose, Bld 118 (*)    All other components within normal limits  URINALYSIS, ROUTINE W REFLEX MICROSCOPIC - Abnormal; Notable for the following components:   Color, Urine YELLOW (*)    APPearance HAZY (*)    Hgb urine dipstick MODERATE (*)    Bacteria, UA RARE (*)    All other components within normal limits  GROUP A STREP BY PCR  RESP PANEL BY RT-PCR (FLU A&B, COVID) [id number]  POC URINE PREG, ED  TROPONIN I (HIGH SENSITIVITY)  TROPONIN I (HIGH SENSITIVITY)     EKG  Vent. rate 84 BPM PR interval 156 ms QRS duration 84 ms QT/QTcB 368/434 ms P-R-T axes 37 28 37 Normal sinus rhythm Normal ECG    RADIOLOGY Chest x-ray without infiltrates and read as normal but low volume per radiologist.    PROCEDURES:  Critical Care performed:   Procedures   MEDICATIONS ORDERED IN ED: Medications  acetaminophen (TYLENOL) tablet 650 mg (650 mg Oral Given 05/06/22 0629)  IMPRESSION / MDM / ASSESSMENT AND PLAN / ED COURSE  I reviewed the triage vital signs and the nursing notes.   Differential diagnosis includes, but is not limited to, COVID, influenza, strep pharyngitis, urinary tract infection, hematuria, kidney stone.  31 year old female presents to the ED with complaint of sore throat and fever along with body aches, nausea and a temperature of 101 that started 2 days ago.  Strep test was negative, COVID and influenza negative.  Pregnancy test was negative.  CMP was unremarkable.  Troponin was 4, urinalysis did show 6-10 RBCs and rare bacteria.  Patient has a history of irregular periods and states it is very possible that she may be starting her period.  WBC was elevated at 16.3.  Chest x-ray per radiologist was negative for acute cardiopulmonary disease.  Patient was made aware that most likely this is a viral.  I discussed this with Dr. Fuller Plan who is my attending at this time.  A prescription for Zofran was sent to the pharmacy to take as needed for nausea and  vomiting.  A prescription for Augmentin was given to the patient that should she continue to have a sore throat for another 2 days she is to start the antibiotic.  She also is encouraged to increase fluids to stay hydrated and follow-up with her PCP if any continued problems.  She also will continue with antipyretics over-the-counter as needed for fever, body aches or headache.      Patient's presentation is most consistent with acute complicated illness / injury requiring diagnostic workup.  FINAL CLINICAL IMPRESSION(S) / ED DIAGNOSES   Final diagnoses:  Viral illness  Viral pharyngitis     Rx / DC Orders   ED Discharge Orders          Ordered    ondansetron (ZOFRAN-ODT) 4 MG disintegrating tablet  Every 8 hours PRN        05/06/22 0942    amoxicillin-clavulanate (AUGMENTIN) 875-125 MG tablet  2 times daily        05/06/22 0943             Note:  This document was prepared using Dragon voice recognition software and may include unintentional dictation errors.   Tommi Rumps, PA-C 05/06/22 1417    Concha Se, MD 05/06/22 [phone]

## 2022-05-06 NOTE — ED Triage Notes (Signed)
Ambulatory to triage with c/o body aches, sore throat and nausea. Max temp at home 101. Sx ongoing for 2 days.  Reports chest pain last night. Midsternal chest pain last night, and reports dark urine.

## 2022-05-06 NOTE — Discharge Instructions (Addendum)
Follow-up with your primary care provider if any continued problems or concerns.  Increase fluids.  Tylenol or ibuprofen as needed for throat pain, body aches, headache or fever.  A prescription for Zofran was sent to the pharmacy to take as needed for nausea or vomiting.  Also a paper prescription was handed to you to get filled if you are still continue to have sore throat after another 2 days.  Most likely this is viral as your strep test was negative.

## 2022-05-06 NOTE — ED Notes (Signed)
See triage note.  Presents with fever,sore throat and body aches  States sx's started couple of days ago

## 2022-12-20 ENCOUNTER — Ambulatory Visit: Payer: BC Managed Care – PPO | Admitting: Advanced Practice Midwife

## 2022-12-31 ENCOUNTER — Other Ambulatory Visit: Payer: Self-pay | Admitting: Obstetrics & Gynecology

## 2022-12-31 ENCOUNTER — Ambulatory Visit (INDEPENDENT_AMBULATORY_CARE_PROVIDER_SITE_OTHER): Payer: BC Managed Care – PPO

## 2022-12-31 VITALS — BP 163/89 | HR 47

## 2022-12-31 DIAGNOSIS — R300 Dysuria: Secondary | ICD-10-CM

## 2022-12-31 NOTE — Progress Notes (Signed)
SUBJECTIVE: Danielle Waters is a 32 y.o. female who complains of possible UTI days, without flank pain, fever, chills, or abnormal vaginal discharge or bleeding.   OBJECTIVE: Appears well, in no apparent distress.Urine dipstick shows not done. Per pt request. Pt states she has to get to her child. B/P not repeated.  ASSESSMENT: Dysuria  PLAN: Treatment per orders.  Call or return to clinic prn if these symptoms worsen or fail to improve as anticipated.

## 2023-01-01 LAB — URINE CULTURE

## 2023-01-12 DIAGNOSIS — S99912A Unspecified injury of left ankle, initial encounter: Secondary | ICD-10-CM | POA: Diagnosis not present

## 2023-01-12 DIAGNOSIS — M25572 Pain in left ankle and joints of left foot: Secondary | ICD-10-CM | POA: Diagnosis not present

## 2023-01-12 DIAGNOSIS — R609 Edema, unspecified: Secondary | ICD-10-CM | POA: Diagnosis not present

## 2024-07-24 ENCOUNTER — Encounter: Payer: Self-pay | Admitting: *Deleted

## 2024-07-24 ENCOUNTER — Other Ambulatory Visit: Payer: Self-pay

## 2024-07-24 ENCOUNTER — Ambulatory Visit
Admission: EM | Admit: 2024-07-24 | Discharge: 2024-07-24 | Disposition: A | Discharge status: Home / Self Care | Attending: Physician Assistant | Admitting: Physician Assistant

## 2024-07-24 DIAGNOSIS — R051 Acute cough: Secondary | ICD-10-CM | POA: Insufficient documentation

## 2024-07-24 DIAGNOSIS — B9789 Other viral agents as the cause of diseases classified elsewhere: Secondary | ICD-10-CM | POA: Diagnosis present

## 2024-07-24 DIAGNOSIS — J029 Acute pharyngitis, unspecified: Secondary | ICD-10-CM | POA: Diagnosis present

## 2024-07-24 DIAGNOSIS — J038 Acute tonsillitis due to other specified organisms: Secondary | ICD-10-CM | POA: Diagnosis present

## 2024-07-24 LAB — RESP PANEL BY RT-PCR (FLU A&B, COVID) ARPGX2
Influenza A by PCR: NEGATIVE
Influenza B by PCR: NEGATIVE
SARS Coronavirus 2 by RT PCR: NEGATIVE

## 2024-07-24 LAB — GROUP A STREP BY PCR: Group A Strep by PCR: NOT DETECTED

## 2024-07-24 MED ORDER — LIDOCAINE VISCOUS HCL 2 % MT SOLN: 15.0000 mL | OROMUCOSAL | 0 refills | Status: AC | PRN

## 2024-07-24 MED ORDER — PREDNISONE 20 MG PO TABS: 40.0000 mg | ORAL_TABLET | Freq: Every day | ORAL | 0 refills | Status: AC

## 2024-07-24 NOTE — Discharge Instructions (Addendum)
-  Negative strep, COVID and flu -You have viral tonsillitis.  -I sent prednisone  to the pharmacy. This can help with throat swelling/tonsil swelling. I also sent viscous lidocaine .  -If you have a fever or worsening symptoms please return for re-evaluation.

## 2024-07-24 NOTE — ED Triage Notes (Signed)
 PT reports she knows she has Strep throat. Pt reports having a sore throa and throat is white. Pt also has a HA.

## 2024-07-24 NOTE — ED Provider Notes (Signed)
 [facility]    CSN: [id number] Arrival date & time: 07/24/24  1736      History   Chief Complaint Chief Complaint  Patient presents with   Cough   Fatigue   Sore Throat    HPI Danielle Waters is a 33 y.o. female presenting for fatigue, headache, dry cough and sore throat x 2 days.  Denies fever, chest pain, shortness of breath, vomiting or diarrhea.  Patient did see white exudates on her tonsils and is confident she has strep throat.  Her son is also ill.   HPI  Past Medical History:  Diagnosis Date   Influenza A    Medical history non-contributory    Pregnancy induced hypertension     Patient Active Problem List   Diagnosis Date Noted   Pulmonary edema 06/17/2019   History of pre-eclampsia 06/13/2019   HSV-2 seropositive 02/19/2019   Herpes exposure 01/19/2019   Pre-diabetes 09/04/2017   PCOS (polycystic ovarian syndrome) 08/28/2017   Obesity (BMI 30-39.9) 08/26/2017    Past Surgical History:  Procedure Laterality Date   APPENDECTOMY     DILATION AND CURETTAGE OF UTERUS     LAPAROSCOPIC APPENDECTOMY N/A 10/30/2018   Procedure: APPENDECTOMY LAPAROSCOPIC;  Surgeon: Desiderio Schanz, MD;  Location: [facility] ORS;  Service: General;  Laterality: N/A;    OB History     Gravida  2   Para  1   Term  0   Preterm  1   AB  1   Living  1      SAB  1   IAB  0   Ectopic  0   Multiple  0   Live Births  1            Home Medications    Prior to Admission medications   Medication Sig Start Date End Date Taking? Authorizing Provider  lidocaine  (XYLOCAINE ) 2 % solution Use as directed 15 mLs in the mouth or throat every 3 (three) hours as needed for mouth pain (swish and spit). 07/24/24  Yes Arvis Jolan NOVAK, PA-C  predniSONE  (DELTASONE ) 20 MG tablet Take 2 tablets (40 mg total) by mouth daily for 4 days. 07/24/24 07/28/24 Yes Arvis Jolan NOVAK, PA-C  ferrous sulfate  (FERROUSUL) 325 (65 FE) MG tablet Take 1 tablet (325 mg total) by mouth  daily with breakfast. Patient not taking: Reported on 11/16/2020 06/19/19   Fredirick Glenys RAMAN, MD  ibuprofen  (ADVIL ) 600 MG tablet Take 1 tablet (600 mg total) by mouth every 6 (six) hours. 06/16/19   Fredirick Glenys RAMAN, MD  NIFEdipine  (ADALAT  CC) 30 MG 24 hr tablet Take 1 tablet (30 mg total) by mouth 2 (two) times daily. Patient not taking: Reported on 11/16/2020 06/19/19   Fredirick Glenys RAMAN, MD  ondansetron  (ZOFRAN -ODT) 4 MG disintegrating tablet Take 1 tablet (4 mg total) by mouth every 8 (eight) hours as needed for nausea or vomiting. 05/06/22   Saunders Shona CROME, PA-C  Prenatal Vit-Fe Fumarate-FA (MULTIVITAMIN-PRENATAL) 27-0.8 MG TABS tablet Take 1 tablet by mouth daily at 12 noon. Patient not taking: Reported on 11/16/2020    [provider]  triamterene -hydrochlorothiazide  (MAXZIDE -25) 37.5-25 MG tablet Take 1 tablet by mouth daily. Patient not taking: Reported on 11/16/2020 06/20/19   Fredirick Glenys RAMAN, MD    Family History Family History  Problem Relation Age of Onset   Diabetes Mother    Hypertension Mother    Diabetes Maternal Grandmother    Hypertension Maternal Grandmother  Social History Social History   Tobacco Use   Smoking status: Never   Smokeless tobacco: Never  Vaping Use   Vaping status: Never Used  Substance Use Topics   Alcohol use: Not Currently   Drug use: No     Allergies   Flagyl  [metronidazole ]   Review of Systems Review of Systems  Constitutional:  Positive for fatigue. Negative for chills, diaphoresis and fever.  HENT:  Positive for sore throat. Negative for congestion, ear pain, rhinorrhea and sinus pain.   Respiratory:  Positive for cough. Negative for shortness of breath.   Cardiovascular:  Negative for chest pain.  Gastrointestinal:  Negative for abdominal pain, nausea and vomiting.  Musculoskeletal:  Negative for arthralgias and myalgias.  Skin:  Negative for rash.  Neurological:  Positive for headaches. Negative for weakness.  Hematological:   Negative for adenopathy.     Physical Exam Triage Vital Signs ED Triage Vitals  Encounter Vitals Group     BP      Girls Systolic BP Percentile      Girls Diastolic BP Percentile      Boys Systolic BP Percentile      Boys Diastolic BP Percentile      Pulse      Resp      Temp      Temp src      SpO2      Weight      Height      Head Circumference      Peak Flow      Pain Score      Pain Loc      Pain Education      Exclude from Growth Chart    No data found.  Updated Vital Signs BP 128/79   Pulse 69   Temp 99.6 F (37.6 C)   Resp 18   LMP 07/20/2024 (Approximate)   SpO2 100%      Physical Exam Vitals and nursing note reviewed.  Constitutional:      General: She is not in acute distress.    Appearance: Normal appearance. She is not ill-appearing or toxic-appearing.  HENT:     Head: Normocephalic and atraumatic.     Nose: Nose normal.     Mouth/Throat:     Mouth: Mucous membranes are moist.     Pharynx: Oropharynx is clear. Posterior oropharyngeal erythema present.     Tonsils: Tonsillar exudate present. 2+ on the right. 2+ on the left.  Eyes:     General: No scleral icterus.       Right eye: No discharge.        Left eye: No discharge.     Conjunctiva/sclera: Conjunctivae normal.  Cardiovascular:     Rate and Rhythm: Normal rate and regular rhythm.     Heart sounds: Normal heart sounds.  Pulmonary:     Effort: Pulmonary effort is normal. No respiratory distress.     Breath sounds: Normal breath sounds.  Musculoskeletal:     Cervical back: Neck supple.  Skin:    General: Skin is dry.  Neurological:     General: No focal deficit present.     Mental Status: She is alert. Mental status is at baseline.     Motor: No weakness.     Gait: Gait normal.  Psychiatric:        Mood and Affect: Mood normal.        Behavior: Behavior normal.      UC Treatments / Results  Labs (all labs ordered are listed, but only abnormal results are displayed) Labs  Reviewed  GROUP A STREP BY PCR  RESP PANEL BY RT-PCR (FLU A&B, COVID) [id number]    EKG   Radiology No results found.  Procedures Procedures (including critical care time)  Medications Ordered in UC Medications - No data to display  Initial Impression / Assessment and Plan / UC Course  I have reviewed the triage vital signs and the nursing notes.  Pertinent labs & imaging results that were available during my care of the patient were reviewed by me and considered in my medical decision making (see chart for details).   33 y/o female presents for sore throat, tonsillar exudates, fatigue, dry cough and headaches x 2 days.   PCR strep and resp panel obtained. All negative.  Viral tonsillitis. Supportive care. Sent prednisone  and viscous lidocaine . Advised OTC meds. Reviewed return precautions.   Final Clinical Impressions(s) / UC Diagnoses   Final diagnoses:  Acute viral tonsillitis  Sore throat  Acute cough     Discharge Instructions      -Negative strep, COVID and flu -You have viral tonsillitis.  -I sent prednisone  to the pharmacy. This can help with throat swelling/tonsil swelling. I also sent viscous lidocaine .  -If you have a fever or worsening symptoms please return for re-evaluation.      ED Prescriptions     Medication Sig Dispense Auth. Provider   lidocaine  (XYLOCAINE ) 2 % solution Use as directed 15 mLs in the mouth or throat every 3 (three) hours as needed for mouth pain (swish and spit). 100 mL Arvis Huxley B, PA-C   predniSONE  (DELTASONE ) 20 MG tablet Take 2 tablets (40 mg total) by mouth daily for 4 days. 8 tablet Detravion Tester B, PA-C      PDMP not reviewed this encounter.   Arvis Huxley NOVAK, PA-C 07/24/24 [phone]

## 2024-10-16 ENCOUNTER — Ambulatory Visit
Admission: EM | Admit: 2024-10-16 | Discharge: 2024-10-16 | Disposition: A | Discharge status: Home / Self Care | Attending: Family Medicine | Admitting: Family Medicine

## 2024-10-16 ENCOUNTER — Encounter: Payer: Self-pay | Admitting: Emergency Medicine

## 2024-10-16 DIAGNOSIS — H6993 Unspecified Eustachian tube disorder, bilateral: Secondary | ICD-10-CM | POA: Insufficient documentation

## 2024-10-16 DIAGNOSIS — J029 Acute pharyngitis, unspecified: Secondary | ICD-10-CM | POA: Insufficient documentation

## 2024-10-16 LAB — POCT MONO SCREEN (KUC): Mono, POC: NEGATIVE

## 2024-10-16 LAB — POCT RAPID STREP A (OFFICE): Rapid Strep A Screen: NEGATIVE

## 2024-10-16 MED ORDER — FLUTICASONE PROPIONATE 50 MCG/ACT NA SUSP: 1.0000 | Freq: Every day | NASAL | 0 refills | Status: AC

## 2024-10-16 MED ORDER — AMOXICILLIN 500 MG PO CAPS: 500.0000 mg | ORAL_CAPSULE | Freq: Two times a day (BID) | ORAL | 0 refills | Status: AC

## 2024-10-16 NOTE — ED Provider Notes (Signed)
 " [facility]    CSN: [id number] Arrival date & time: 10/16/24  9175      History   Chief Complaint Chief Complaint  Patient presents with   Sore Throat    HPI Danielle Waters is a 34 y.o. female  presents for evaluation of URI symptoms for 8 days. Patient reports associated symptoms of persistent sore throat.  States she initially had cough and congestion but the symptoms have resolved with a sore throat remaining.  Denies N/V/D, fevers. Patient does not have a hx of asthma. Patient is not an active smoker.   Reports no known sick contacts.  Pt has taken cold medicine OTC for symptoms. Pt has no other concerns at this time.    Sore Throat    Past Medical History:  Diagnosis Date   Influenza A    Medical history non-contributory    Pregnancy induced hypertension     Patient Active Problem List   Diagnosis Date Noted   Pulmonary edema 06/17/2019   History of pre-eclampsia 06/13/2019   HSV-2 seropositive 02/19/2019   Herpes exposure 01/19/2019   Pre-diabetes 09/04/2017   PCOS (polycystic ovarian syndrome) 08/28/2017   Obesity (BMI 30-39.9) 08/26/2017    Past Surgical History:  Procedure Laterality Date   APPENDECTOMY     DILATION AND CURETTAGE OF UTERUS     LAPAROSCOPIC APPENDECTOMY N/A 10/30/2018   Procedure: APPENDECTOMY LAPAROSCOPIC;  Surgeon: Desiderio Schanz, MD;  Location: [facility] ORS;  Service: General;  Laterality: N/A;    OB History     Gravida  2   Para  1   Term  0   Preterm  1   AB  1   Living  1      SAB  1   IAB  0   Ectopic  0   Multiple  0   Live Births  1            Home Medications    Prior to Admission medications  Medication Sig Start Date End Date Taking? Authorizing Provider  amoxicillin  (AMOXIL ) 500 MG capsule Take 1 capsule (500 mg total) by mouth 2 (two) times daily for 10 days. 10/16/24 10/26/24 Yes Brandalynn Ofallon, Jodi R, NP  fluticasone (FLONASE) 50 MCG/ACT nasal spray Place 1 spray into both nostrils  daily. 10/16/24  Yes Loreda Myla SAUNDERS, NP  ferrous sulfate  (FERROUSUL) 325 (65 FE) MG tablet Take 1 tablet (325 mg total) by mouth daily with breakfast. Patient not taking: Reported on 11/16/2020 06/19/19   Fredirick Glenys RAMAN, MD  ibuprofen  (ADVIL ) 600 MG tablet Take 1 tablet (600 mg total) by mouth every 6 (six) hours. 06/16/19   Fredirick Glenys RAMAN, MD  lidocaine  (XYLOCAINE ) 2 % solution Use as directed 15 mLs in the mouth or throat every 3 (three) hours as needed for mouth pain (swish and spit). 07/24/24   Arvis Jolan NOVAK, PA-C  NIFEdipine  (ADALAT  CC) 30 MG 24 hr tablet Take 1 tablet (30 mg total) by mouth 2 (two) times daily. Patient not taking: Reported on 11/16/2020 06/19/19   Fredirick Glenys RAMAN, MD  ondansetron  (ZOFRAN -ODT) 4 MG disintegrating tablet Take 1 tablet (4 mg total) by mouth every 8 (eight) hours as needed for nausea or vomiting. 05/06/22   Saunders Shona CROME, PA-C  Prenatal Vit-Fe Fumarate-FA (MULTIVITAMIN-PRENATAL) 27-0.8 MG TABS tablet Take 1 tablet by mouth daily at 12 noon. Patient not taking: Reported on 11/16/2020    [provider]  triamterene -hydrochlorothiazide  (MAXZIDE -25) 37.5-25 MG tablet Take 1  tablet by mouth daily. Patient not taking: Reported on 11/16/2020 06/20/19   Fredirick Glenys RAMAN, MD    Family History Family History  Problem Relation Age of Onset   Diabetes Mother    Hypertension Mother    Diabetes Maternal Grandmother    Hypertension Maternal Grandmother     Social History Social History[1]   Allergies   Flagyl  [metronidazole ]   Review of Systems Review of Systems  HENT:  Positive for sore throat.      Physical Exam Triage Vital Signs ED Triage Vitals  Encounter Vitals Group     BP 10/16/24 0842 (!) 144/85     Girls Systolic BP Percentile --      Girls Diastolic BP Percentile --      Boys Systolic BP Percentile --      Boys Diastolic BP Percentile --      Pulse Rate 10/16/24 0842 65     Resp 10/16/24 0842 14     Temp 10/16/24 0842 99.2 F (37.3 C)      Temp Source 10/16/24 0842 Oral     SpO2 10/16/24 0842 97 %     Weight 10/16/24 0839 193 lb 3.2 oz (87.6 kg)     Height 10/16/24 0839 5' 3 (1.6 m)     Head Circumference --      Peak Flow --      Pain Score 10/16/24 0838 10     Pain Loc --      Pain Education --      Exclude from Growth Chart --    No data found.  Updated Vital Signs BP (!) 144/85 (BP Location: Right Arm)   Pulse 65   Temp 99.2 F (37.3 C) (Oral)   Resp 14   Ht 5' 3 (1.6 m)   Wt 193 lb 3.2 oz (87.6 kg)   SpO2 97%   Breastfeeding No   BMI 34.22 kg/m   Visual Acuity Right Eye Distance:   Left Eye Distance:   Bilateral Distance:    Right Eye Near:   Left Eye Near:    Bilateral Near:     Physical Exam Vitals and nursing note reviewed.  Constitutional:      General: She is not in acute distress.    Appearance: She is well-developed. She is not ill-appearing.  HENT:     Head: Normocephalic and atraumatic.     Right Ear: Ear canal normal. A middle ear effusion is present.     Left Ear: Ear canal normal. A middle ear effusion is present.     Nose: No congestion.     Mouth/Throat:     Mouth: Mucous membranes are moist.     Pharynx: Oropharynx is clear. Uvula midline. Posterior oropharyngeal erythema present.     Tonsils: No tonsillar exudate or tonsillar abscesses.  Eyes:     Conjunctiva/sclera: Conjunctivae normal.     Pupils: Pupils are equal, round, and reactive to light.  Cardiovascular:     Rate and Rhythm: Normal rate and regular rhythm.     Heart sounds: Normal heart sounds.  Pulmonary:     Effort: Pulmonary effort is normal.     Breath sounds: Normal breath sounds.  Musculoskeletal:     Cervical back: Normal range of motion and neck supple.  Lymphadenopathy:     Cervical: No cervical adenopathy.  Skin:    General: Skin is warm and dry.  Neurological:     General: No focal deficit present.  Mental Status: She is alert and oriented to person, place, and time.  Psychiatric:         Mood and Affect: Mood normal.        Behavior: Behavior normal.      UC Treatments / Results  Labs (all labs ordered are listed, but only abnormal results are displayed) Labs Reviewed  POCT RAPID STREP A (OFFICE) - Normal  POCT MONO SCREEN ([facility]) - Normal  CULTURE, GROUP A STREP [facility])    EKG   Radiology No results found.  Procedures Procedures (including critical care time)  Medications Ordered in UC Medications - No data to display  Initial Impression / Assessment and Plan / UC Course  I have reviewed the triage vital signs and the nursing notes.  Pertinent labs & imaging results that were available during my care of the patient were reviewed by me and considered in my medical decision making (see chart for details).     Reviewed exam and symptoms with patient.  Negative rapid strep and mono testing.  Will send strep throat culture.  Given persistent symptoms we will treat with amoxicillin  while awaiting strep throat culture.  Flonase daily.  Encouraged continued OTC analgesics, salt water gargles and warm liquid such as teas with honey.  PCP follow-up 2 days for recheck.  ER precautions reviewed. Final Clinical Impressions(s) / UC Diagnoses   Final diagnoses:  Sore throat  Acute pharyngitis, unspecified etiology  Eustachian tube dysfunction, bilateral     Discharge Instructions      You tested negative for strep throat and mono.  The clinical contact you with results of the strep culture done today are positive.  Start amoxicillin  twice daily for 10 days.  Start Flonase daily as well.  Continue ibuprofen  and Tylenol , salt water gargles and warm liquids such as teas and honey.  Follow-up with your PCP in 2 days for recheck.  Please go to the ER if you develop any worsening symptoms.  Hope you feel better soon!     ED Prescriptions     Medication Sig Dispense Auth. Provider   fluticasone (FLONASE) 50 MCG/ACT nasal spray Place 1 spray into both nostrils daily.  15.8 mL Friend Dorfman, Jodi R, NP   amoxicillin  (AMOXIL ) 500 MG capsule Take 1 capsule (500 mg total) by mouth 2 (two) times daily for 10 days. 20 capsule Denzell Colasanti, Jodi R, NP      PDMP not reviewed this encounter.     [1]  Social History Tobacco Use   Smoking status: Never   Smokeless tobacco: Never  Vaping Use   Vaping status: Never Used  Substance Use Topics   Alcohol use: Not Currently   Drug use: No     Loreda Myla SAUNDERS, NP 10/16/24 [phone]  "

## 2024-10-16 NOTE — ED Triage Notes (Signed)
 Patient reports sore throat that started on Christmas Day.  Patient denies fevers.

## 2024-10-16 NOTE — Discharge Instructions (Addendum)
 You tested negative for strep throat and mono.  The clinical contact you with results of the strep culture done today are positive.  Start amoxicillin  twice daily for 10 days.  Start Flonase daily as well.  Continue ibuprofen  and Tylenol , salt water gargles and warm liquids such as teas and honey.  Follow-up with your PCP in 2 days for recheck.  Please go to the ER if you develop any worsening symptoms.  Hope you feel better soon!

## 2024-10-19 LAB — CULTURE, GROUP A STREP (THRC)
